# Patient Record
Sex: Female | Born: 1985 | Race: Black or African American | Hispanic: No | Smoking: Current every day smoker
Health system: Southern US, Community
[De-identification: ages and names within clinical notes are randomized; demographics above are authoritative.]

## PROBLEM LIST (undated history)

## (undated) DIAGNOSIS — K589 Irritable bowel syndrome without diarrhea: Secondary | ICD-10-CM

---
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
## 2007-11-27 ENCOUNTER — Emergency Department (HOSPITAL_COMMUNITY): Admission: EM | Admit: 2007-11-27 | Discharge: 2007-11-27 | Payer: Self-pay | Admitting: Emergency Medicine

## 2007-12-04 ENCOUNTER — Emergency Department (HOSPITAL_COMMUNITY): Admission: EM | Admit: 2007-12-04 | Discharge: 2007-12-04 | Payer: Self-pay | Admitting: Emergency Medicine

## 2009-12-31 ENCOUNTER — Encounter: Payer: Self-pay | Admitting: Emergency Medicine

## 2009-12-31 ENCOUNTER — Inpatient Hospital Stay (HOSPITAL_COMMUNITY): Admission: AD | Admit: 2009-12-31 | Discharge: 2009-12-31 | Payer: Self-pay | Admitting: Obstetrics and Gynecology

## 2010-09-27 LAB — DIFFERENTIAL
Basophils Absolute: 0 10*3/uL (ref 0.0–0.1)
Eosinophils Absolute: 0 10*3/uL (ref 0.0–0.7)
Eosinophils Relative: 1 % (ref 0–5)
Lymphocytes Relative: 34 % (ref 12–46)
Lymphs Abs: 1.6 10*3/uL (ref 0.7–4.0)
Monocytes Absolute: 0.4 10*3/uL (ref 0.1–1.0)
Monocytes Relative: 8 % (ref 3–12)
Neutro Abs: 2.7 10*3/uL (ref 1.7–7.7)
Neutrophils Relative %: 57 % (ref 43–77)

## 2010-09-27 LAB — POCT I-STAT, CHEM 8
BUN: 12 mg/dL (ref 6–23)
Glucose, Bld: 81 mg/dL (ref 70–99)
HCT: 40 % (ref 36.0–46.0)
Potassium: 3.7 mEq/L (ref 3.5–5.1)
TCO2: 22 mmol/L (ref 0–100)

## 2010-09-27 LAB — URINALYSIS, ROUTINE W REFLEX MICROSCOPIC
Ketones, ur: NEGATIVE mg/dL
Protein, ur: NEGATIVE mg/dL
Specific Gravity, Urine: 1.026 (ref 1.005–1.030)
Urobilinogen, UA: 0.2 mg/dL (ref 0.0–1.0)
pH: 6 (ref 5.0–8.0)

## 2010-09-27 LAB — CBC
Hemoglobin: 13.4 g/dL (ref 12.0–15.0)
MCHC: 34.6 g/dL (ref 30.0–36.0)
Platelets: 188 10*3/uL (ref 150–400)
RBC: 3.93 MIL/uL (ref 3.87–5.11)
WBC: 4.7 10*3/uL (ref 4.0–10.5)

## 2010-09-27 LAB — POCT PREGNANCY, URINE: Preg Test, Ur: NEGATIVE

## 2010-09-27 LAB — GC/CHLAMYDIA PROBE AMP, GENITAL: GC Probe Amp, Genital: NEGATIVE

## 2010-09-27 LAB — WET PREP, GENITAL

## 2011-11-27 ENCOUNTER — Emergency Department (HOSPITAL_COMMUNITY)
Admission: EM | Admit: 2011-11-27 | Discharge: 2011-11-27 | Disposition: A | Discharge status: Home / Self Care | Payer: Self-pay | Attending: Emergency Medicine | Admitting: Emergency Medicine

## 2011-11-27 ENCOUNTER — Encounter (HOSPITAL_COMMUNITY): Payer: Self-pay | Admitting: Emergency Medicine

## 2011-11-27 DIAGNOSIS — F17200 Nicotine dependence, unspecified, uncomplicated: Secondary | ICD-10-CM | POA: Insufficient documentation

## 2011-11-27 DIAGNOSIS — R109 Unspecified abdominal pain: Secondary | ICD-10-CM | POA: Insufficient documentation

## 2011-11-27 DIAGNOSIS — Z79899 Other long term (current) drug therapy: Secondary | ICD-10-CM | POA: Insufficient documentation

## 2011-11-27 DIAGNOSIS — R112 Nausea with vomiting, unspecified: Secondary | ICD-10-CM | POA: Insufficient documentation

## 2011-11-27 DIAGNOSIS — R197 Diarrhea, unspecified: Secondary | ICD-10-CM | POA: Insufficient documentation

## 2011-11-27 DIAGNOSIS — R10817 Generalized abdominal tenderness: Secondary | ICD-10-CM | POA: Insufficient documentation

## 2011-11-27 LAB — COMPREHENSIVE METABOLIC PANEL
ALT: 51 U/L — ABNORMAL HIGH (ref 0–35)
AST: 28 U/L (ref 0–37)
Calcium: 9.6 mg/dL (ref 8.4–10.5)
GFR calc Af Amer: >90 mL/min (ref >90)
Sodium: 136 mEq/L (ref 135–145)
Total Protein: 7.9 g/dL (ref 6.0–8.3)

## 2011-11-27 LAB — DIFFERENTIAL
Basophils Absolute: 0 10*3/uL (ref 0.0–0.1)
Basophils Relative: 0 % (ref 0–1)
Eosinophils Absolute: 0.1 10*3/uL (ref 0.0–0.7)
Eosinophils Relative: 1 % (ref 0–5)

## 2011-11-27 LAB — CBC
MCH: 33.2 pg (ref 26.0–34.0)
MCV: 96 fL (ref 78.0–100.0)
Platelets: 292 10*3/uL (ref 150–400)
RDW: 12.9 % (ref 11.5–15.5)

## 2011-11-27 LAB — URINALYSIS, ROUTINE W REFLEX MICROSCOPIC
Bilirubin Urine: NEGATIVE
Nitrite: NEGATIVE
Specific Gravity, Urine: 1.021 (ref 1.005–1.030)
pH: 7 (ref 5.0–8.0)

## 2011-11-27 MED ORDER — ONDANSETRON HCL 4 MG/2ML IJ SOLN
4.0000 mg | Freq: Once | INTRAMUSCULAR | Status: AC
  Administered 2011-11-27: 4 mg via INTRAVENOUS
  Filled 2011-11-27: qty 2

## 2011-11-27 MED ORDER — MORPHINE SULFATE 2 MG/ML IJ SOLN
2.0000 mg | Freq: Once | INTRAMUSCULAR | Status: AC
  Administered 2011-11-27: 2 mg via INTRAVENOUS
  Filled 2011-11-27: qty 1

## 2011-11-27 MED ORDER — TRAMADOL HCL 50 MG PO TABS: 50.0000 mg | ORAL_TABLET | Freq: Four times a day (QID) | ORAL | Status: AC | PRN

## 2011-11-27 MED ORDER — DICYCLOMINE HCL 20 MG PO TABS: 20.0000 mg | ORAL_TABLET | Freq: Two times a day (BID) | ORAL | Status: DC

## 2011-11-27 MED ORDER — SODIUM CHLORIDE 0.9 % IV BOLUS (SEPSIS)
1000.0000 mL | Freq: Once | INTRAVENOUS | Status: AC
  Administered 2011-11-27: 1000 mL via INTRAVENOUS

## 2011-11-27 MED ORDER — ONDANSETRON HCL 4 MG PO TABS: 4.0000 mg | ORAL_TABLET | Freq: Four times a day (QID) | ORAL | Status: AC

## 2011-11-27 NOTE — ED Provider Notes (Signed)
History     CSN: [id number]  Arrival date & time 11/27/11  1430   First MD Initiated Contact with Patient 11/27/11 1501      Chief Complaint  Patient presents with  . Nausea  . Emesis  . Diarrhea    (Consider location/radiation/quality/duration/timing/severity/associated sxs/prior treatment) HPI Pt states she think she ate bad hamburger meat 2 days ago and began vomiting and then progressed to diarrhea. She has had multiple episodes today associated with generalized stomach cramps. No fever chills, bloody stools or vomitus. No urinary or vaginal symptoms History reviewed. No pertinent past medical history.  History reviewed. No pertinent past surgical history.  History reviewed. No pertinent family history.  History  Substance Use Topics  . Smoking status: Current Some Day Smoker  . Smokeless tobacco: Not on file  . Alcohol Use: Yes    OB History    Grav Para Term Preterm Abortions TAB SAB Ect Mult Living                  Review of Systems  Constitutional: Negative for fever and chills.  Gastrointestinal: Positive for nausea, vomiting, abdominal pain and diarrhea. Negative for constipation and blood in stool.  Genitourinary: Negative for dysuria, flank pain, vaginal bleeding and vaginal discharge.  Skin: Negative for rash.  Neurological: Negative for weakness and numbness.    Allergies  Review of patient's allergies indicates no known allergies.  Home Medications   Current Outpatient Rx  Name Route Sig Dispense Refill  . ACETAMINOPHEN 500 MG PO TABS Oral Take 1,000 mg by mouth every 6 (six) hours as needed. For pain    . MIDOL COMPLETE PO Oral Take 3 tablets by mouth every 6 (six) hours as needed. For cramping    . DICYCLOMINE HCL 20 MG PO TABS Oral Take 1 tablet (20 mg total) by mouth 2 (two) times daily. 20 tablet 0  . ONDANSETRON HCL 4 MG PO TABS Oral Take 1 tablet (4 mg total) by mouth every 6 (six) hours. 12 tablet 0  . TRAMADOL HCL 50 MG PO TABS Oral  Take 1 tablet (50 mg total) by mouth every 6 (six) hours as needed for pain. 15 tablet 0    BP 103/69  Pulse 63  Temp(Src) 98.1 F (36.7 C) (Oral)  Resp 20  SpO2 100%  LMP 11/19/2011  Physical Exam  Nursing note and vitals reviewed. Constitutional: She is oriented to person, place, and time. She appears well-developed and well-nourished. No distress.  HENT:  Head: Normocephalic and atraumatic.  Mouth/Throat: Oropharynx is clear and moist. No oropharyngeal exudate.  Eyes: EOM are normal. Pupils are equal, round, and reactive to light.  Neck: Normal range of motion. Neck supple.  Cardiovascular: Normal rate and regular rhythm.   Pulmonary/Chest: Effort normal and breath sounds normal. No respiratory distress. She has no wheezes. She has no rales.  Abdominal: Soft. Bowel sounds are normal. There is tenderness (very mild generalized TTP without focality, rebound or guarding). There is no rebound and no guarding.  Musculoskeletal: Normal range of motion. She exhibits no edema and no tenderness.  Neurological: She is alert and oriented to person, place, and time.  Skin: Skin is warm and dry. No rash noted. No erythema.  Psychiatric: She has a normal mood and affect. Her behavior is normal.    ED Course  Procedures (including critical care time)  Labs Reviewed  COMPREHENSIVE METABOLIC PANEL - Abnormal; Notable for the following:    ALT 51 (*)  All other components within normal limits  CBC  DIFFERENTIAL  URINALYSIS, ROUTINE W REFLEX MICROSCOPIC  PREGNANCY, URINE   No results found.   1. Vomiting and diarrhea       MDM  Likely food poisoning vs viral gastroenteritis        Loren Racer, MD 11/27/11 [phone]

## 2011-11-27 NOTE — ED Notes (Signed)
Pt c/o N/V/D onset Thursday. Pt reports nausea and vomiting resolved but still had diarrhea.

## 2011-11-27 NOTE — Discharge Instructions (Signed)
Diet for Diarrhea, Adult Having frequent, runny stools (diarrhea) has many causes. Diarrhea may be caused or worsened by food or drink. Diarrhea may be relieved by changing your diet. IF YOU ARE NOT TOLERATING SOLID FOODS:  Drink enough water and fluids to keep your urine clear or pale yellow.   Avoid sugary drinks and sodas as well as milk-based beverages.   Avoid beverages containing caffeine and alcohol.   You may try rehydrating beverages. You can make your own by following this recipe:    tsp table salt.    tsp baking soda.   ? tsp salt substitute (potassium chloride).   1 tbs + 1 tsp sugar.   1 qt water.  As your stools become more solid, you can start eating solid foods. Add foods one at a time. If a certain food causes your diarrhea to get worse, avoid that food and try other foods. A low fiber, low-fat, and lactose-free diet is recommended. Small, frequent meals may be better tolerated.  Starches  Allowed:  White, French, and pita breads, plain rolls, buns, bagels. Plain muffins, matzo. Soda, saltine, or graham crackers. Pretzels, melba toast, zwieback. Cooked cereals made with water: cornmeal, farina, cream cereals. Dry cereals: refined corn, wheat, rice. Potatoes prepared any way without skins, refined macaroni, spaghetti, noodles, refined rice.   Avoid:  Bread, rolls, or crackers made with whole wheat, multi-grains, rye, bran seeds, nuts, or coconut. Corn tortillas or taco shells. Cereals containing whole grains, multi-grains, bran, coconut, nuts, or raisins. Cooked or dry oatmeal. Coarse wheat cereals, granola. Cereals advertised as "high-fiber." Potato skins. Whole grain pasta, wild or brown rice. Popcorn. Sweet potatoes/yams. Sweet rolls, doughnuts, waffles, pancakes, sweet breads.  Vegetables  Allowed: Strained tomato and vegetable juices. Most well-cooked and canned vegetables without seeds. Fresh: Tender lettuce, cucumber without the skin, cabbage, spinach, bean  sprouts.   Avoid: Fresh, cooked, or canned: Artichokes, baked beans, beet greens, broccoli, Brussels sprouts, corn, kale, legumes, peas, sweet potatoes. Cooked: Green or red cabbage, spinach. Avoid large servings of any vegetables, because vegetables shrink when cooked, and they contain more fiber per serving than fresh vegetables.  Fruit  Allowed: All fruit juices except prune juice. Cooked or canned: Apricots, applesauce, cantaloupe, cherries, fruit cocktail, grapefruit, grapes, kiwi, mandarin oranges, peaches, pears, plums, watermelon. Fresh: Apples without skin, ripe banana, grapes, cantaloupe, cherries, grapefruit, peaches, oranges, plums. Keep servings limited to  cup or 1 piece.   Avoid: Fresh: Apple with skin, apricots, mango, pears, raspberries, strawberries. Prune juice, stewed or dried prunes. Dried fruits, raisins, dates. Large servings of all fresh fruits.  Meat and Meat Substitutes  Allowed: Ground or well-cooked tender beef, ham, veal, lamb, pork, or poultry. Eggs, plain cheese. Fish, oysters, shrimp, lobster, other seafoods. Liver, organ meats.   Avoid: Tough, fibrous meats with gristle. Peanut butter, smooth or chunky. Cheese, nuts, seeds, legumes, dried peas, beans, lentils.  Milk  Allowed: Yogurt, lactose-free milk, kefir, drinkable yogurt, buttermilk, soy milk.   Avoid: Milk, chocolate milk, beverages made with milk, such as milk shakes.  Soups  Allowed: Bouillon, broth, or soups made from allowed foods. Any strained soup.   Avoid: Soups made from vegetables that are not allowed, cream or milk-based soups.  Desserts and Sweets  Allowed: Sugar-free gelatin, sugar-free frozen ice pops made without sugar alcohol.   Avoid: Plain cakes and cookies, pie made with allowed fruit, pudding, custard, cream pie. Gelatin, fruit, ice, sherbet, frozen ice pops. Ice cream, ice milk without nuts. Plain hard candy,   honey, jelly, molasses, syrup, sugar, chocolate syrup, gumdrops,  marshmallows.  Fats and Oils  Allowed: Avoid any fats and oils.   Avoid: Seeds, nuts, olives, avocados. Margarine, butter, cream, mayonnaise, salad oils, plain salad dressings made from allowed foods. Plain gravy, crisp bacon without rind.  Beverages  Allowed: Water, decaffeinated teas, oral rehydration solutions, sugar-free beverages.   Avoid: Fruit juices, caffeinated beverages (coffee, tea, soda or pop), alcohol, sports drinks, or lemon-lime soda or pop.  Condiments  Allowed: Ketchup, mustard, horseradish, vinegar, cream sauce, cheese sauce, cocoa powder. Spices in moderation: allspice, basil, bay leaves, celery powder or leaves, cinnamon, cumin powder, curry powder, ginger, mace, marjoram, onion or garlic powder, oregano, paprika, parsley flakes, ground pepper, rosemary, sage, savory, tarragon, thyme, turmeric.   Avoid: Coconut, honey.  Weight Monitoring: Weigh yourself every day. You should weigh yourself in the morning after you urinate and before you eat breakfast. Wear the same amount of clothing when you weigh yourself. Record your weight daily. Bring your recorded weights to your clinic visits. Tell your caregiver right away if you have gained 3 lb/1.4 kg or more in 1 day, 5 lb/2.3 kg in a week, or whatever amount you were told to report. SEEK IMMEDIATE MEDICAL CARE IF:   You are unable to keep fluids down.   You start to throw up (vomit) or diarrhea keeps coming back (persistent).   Abdominal pain develops, increases, or can be felt in one place (localizes).   You have an oral temperature above 102 F (38.9 C), not controlled by medicine.   Diarrhea contains blood or mucus.   You develop excessive weakness, dizziness, fainting, or extreme thirst.  MAKE SURE YOU:   Understand these instructions.   Will watch your condition.   Will get help right away if you are not doing well or get worse.  Document Released: 09/18/2003 Document Revised: 06/17/2011 Document Reviewed:  01/09/2009 [employer] Patient Information 2012 [employer]. 

## 2012-09-17 ENCOUNTER — Emergency Department (HOSPITAL_COMMUNITY)
Admission: EM | Admit: 2012-09-17 | Discharge: 2012-09-17 | Disposition: A | Discharge status: Home / Self Care | Payer: BC Managed Care – PPO | Attending: Emergency Medicine | Admitting: Emergency Medicine

## 2012-09-17 ENCOUNTER — Emergency Department (HOSPITAL_COMMUNITY): Payer: BC Managed Care – PPO

## 2012-09-17 ENCOUNTER — Encounter (HOSPITAL_COMMUNITY): Payer: Self-pay | Admitting: Emergency Medicine

## 2012-09-17 ENCOUNTER — Encounter (HOSPITAL_COMMUNITY): Payer: Self-pay | Admitting: *Deleted

## 2012-09-17 ENCOUNTER — Emergency Department (HOSPITAL_COMMUNITY)
Admission: EM | Admit: 2012-09-17 | Discharge: 2012-09-17 | Disposition: A | Discharge status: Home / Self Care | Payer: BC Managed Care – PPO | Source: Home / Self Care

## 2012-09-17 DIAGNOSIS — S8990XA Unspecified injury of unspecified lower leg, initial encounter: Secondary | ICD-10-CM | POA: Insufficient documentation

## 2012-09-17 DIAGNOSIS — M25562 Pain in left knee: Secondary | ICD-10-CM

## 2012-09-17 DIAGNOSIS — Y92410 Unspecified street and highway as the place of occurrence of the external cause: Secondary | ICD-10-CM | POA: Insufficient documentation

## 2012-09-17 DIAGNOSIS — Y9389 Activity, other specified: Secondary | ICD-10-CM | POA: Insufficient documentation

## 2012-09-17 DIAGNOSIS — M79642 Pain in left hand: Secondary | ICD-10-CM

## 2012-09-17 DIAGNOSIS — S0990XA Unspecified injury of head, initial encounter: Secondary | ICD-10-CM | POA: Insufficient documentation

## 2012-09-17 DIAGNOSIS — R51 Headache: Secondary | ICD-10-CM

## 2012-09-17 DIAGNOSIS — M25549 Pain in joints of unspecified hand: Secondary | ICD-10-CM

## 2012-09-17 MED ORDER — ONDANSETRON 4 MG PO TBDP
4.0000 mg | ORAL_TABLET | Freq: Once | ORAL | Status: AC
  Administered 2012-09-17: 4 mg via ORAL
  Filled 2012-09-17: qty 1

## 2012-09-17 MED ORDER — ACETAMINOPHEN 500 MG PO TABS
1000.0000 mg | ORAL_TABLET | Freq: Once | ORAL | Status: AC
  Administered 2012-09-17: 1000 mg via ORAL
  Filled 2012-09-17: qty 2

## 2012-09-17 MED ORDER — IBUPROFEN 800 MG PO TABS: 800.0000 mg | ORAL_TABLET | Freq: Three times a day (TID) | ORAL | Status: DC

## 2012-09-17 MED ORDER — HYDROCODONE-ACETAMINOPHEN 5-325 MG PO TABS: 2.0000 | ORAL_TABLET | ORAL | Status: DC | PRN
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
## 2012-09-17 NOTE — ED Notes (Signed)
D/c instructions reviewed w/ pt and family - pt and family deny any further questions or concerns at present. Pt ambulating w/ assistance of crutches on d/c, in no acute distress - A&Ox4 - in no acute distress.

## 2012-09-17 NOTE — ED Notes (Signed)
Reports tenderness and pain for 3-4 days, today is swollen.  Reports left index finger is not hurting , but joint at hand is very painful
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
## 2012-09-17 NOTE — ED Notes (Signed)
tient requested work note, verified with physician

## 2012-09-17 NOTE — ED Provider Notes (Signed)
History     CSN: [id number]  Arrival date & time 09/17/12  1151   None     Chief Complaint  Patient presents with  . Hand Pain    (Consider location/radiation/quality/duration/timing/severity/associated sxs/prior treatment) Patient is a 27 y.o. female presenting with hand pain. The history is provided by the patient. No language interpreter was used.  Hand Pain This is a new problem. Episode onset: 4 days. The problem occurs constantly. The problem has been gradually worsening. Nothing aggravates the symptoms. Nothing relieves the symptoms. She has tried nothing for the symptoms.  Pt complains of pain in her left hand,  Pain with bending left 2nd finger  History reviewed. No pertinent past medical history.  History reviewed. No pertinent past surgical history.  No family history on file.  History  Substance Use Topics  . Smoking status: Never Smoker   . Smokeless tobacco: Not on file  . Alcohol Use: Yes    OB History   Grav Para Term Preterm Abortions TAB SAB Ect Mult Living                  Review of Systems  Musculoskeletal: Positive for joint swelling.  All other systems reviewed and are negative.    Allergies  Review of patient's allergies indicates no known allergies.  Home Medications   Current Outpatient Rx  Name  Route  Sig  Dispense  Refill  . acetaminophen (TYLENOL) 500 MG tablet   Oral   Take 1,000 mg by mouth every 6 (six) hours as needed. For pain         . Acetaminophen-Caff-Pyrilamine (MIDOL COMPLETE PO)   Oral   Take 3 tablets by mouth every 6 (six) hours as needed. For cramping         . dicyclomine (BENTYL) 20 MG tablet   Oral   Take 1 tablet (20 mg total) by mouth 2 (two) times daily.   20 tablet   0     BP 124/76  Pulse 75  Temp(Src) 98.3 F (36.8 C) (Oral)  Resp 17  SpO2 100%  LMP 08/20/2012  Physical Exam  Nursing note and vitals reviewed. Constitutional: She appears well-developed and well-nourished.  HENT:   Head: Normocephalic.  Cardiovascular: Normal rate.   Pulmonary/Chest: Effort normal.  Musculoskeletal: She exhibits tenderness.  Swollen 2nd finger, decreased range of motion,  nv and ns intact  Neurological: She is alert.  Skin: Skin is warm.    ED Course  Procedures (including critical care time)  Labs Reviewed - No data to display No results found.   1. Hand joint pain, left       MDM  Finger splint,   Follow up with hand for evaluation this week.   Ibuprofen 800mg  one po tid  Hydrocodone        Lonia Skinner [place], PA-C 09/17/12 1311  Lonia Skinner [place], [place] 09/17/12 1546  Lonia Skinner [place], PA-C 09/17/12 1547

## 2012-09-17 NOTE — ED Notes (Signed)
Patient transported to X-ray 
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
## 2012-09-18 NOTE — ED Provider Notes (Signed)
Medical screening examination/treatment/procedure(s) were performed by non-physician practitioner and as supervising physician I was immediately available for consultation/collaboration.   David Glick, MD 09/18/12 1456 

## 2012-09-19 ENCOUNTER — Encounter (HOSPITAL_COMMUNITY): Payer: Self-pay | Admitting: Emergency Medicine

## 2012-09-19 ENCOUNTER — Emergency Department (HOSPITAL_COMMUNITY)
Admission: EM | Admit: 2012-09-19 | Discharge: 2012-09-19 | Disposition: A | Discharge status: Home / Self Care | Payer: BC Managed Care – PPO | Attending: Emergency Medicine | Admitting: Emergency Medicine

## 2012-09-19 DIAGNOSIS — S8392XA Sprain of unspecified site of left knee, initial encounter: Secondary | ICD-10-CM

## 2012-09-19 DIAGNOSIS — IMO0002 Reserved for concepts with insufficient information to code with codable children: Secondary | ICD-10-CM | POA: Insufficient documentation

## 2012-09-19 DIAGNOSIS — Y92410 Unspecified street and highway as the place of occurrence of the external cause: Secondary | ICD-10-CM | POA: Insufficient documentation

## 2012-09-19 DIAGNOSIS — Y9389 Activity, other specified: Secondary | ICD-10-CM | POA: Insufficient documentation
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
## 2012-09-19 NOTE — Progress Notes (Signed)
Orthopedic Tech Progress Note Patient Details:  Jean Reynolds [birth year] [id number] Finger splint and knee immobilizer applied. Tolerated well. Care instructions provided.   Ortho Devices Type of Ortho Device: Finger splint;Knee Immobilizer Ortho Device/Splint Interventions: Application   Asia R Thompson 09/19/2012, 11:29 AM

## 2012-09-19 NOTE — ED Provider Notes (Signed)
Medical screening examination/treatment/procedure(s) were performed by non-physician practitioner and as supervising physician I was immediately available for consultation/collaboration.    Vida Roller, MD 09/19/12 2139

## 2012-09-23 ENCOUNTER — Emergency Department (HOSPITAL_COMMUNITY)
Admission: EM | Admit: 2012-09-23 | Discharge: 2012-09-23 | Disposition: A | Discharge status: Home / Self Care | Payer: BC Managed Care – PPO | Attending: Emergency Medicine | Admitting: Emergency Medicine

## 2012-09-23 ENCOUNTER — Encounter (HOSPITAL_COMMUNITY): Payer: Self-pay | Admitting: Nurse Practitioner

## 2012-09-23 DIAGNOSIS — M79645 Pain in left finger(s): Secondary | ICD-10-CM

## 2012-09-23 DIAGNOSIS — M25549 Pain in joints of unspecified hand: Secondary | ICD-10-CM | POA: Insufficient documentation

## 2012-09-23 NOTE — ED Provider Notes (Signed)
Medical screening examination/treatment/procedure(s) were performed by non-physician practitioner and as supervising physician I was immediately available for consultation/collaboration.    Oyindamola Key R Lisett Dirusso, MD 09/23/12 1612 
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
## 2012-09-23 NOTE — ED Provider Notes (Signed)
History     CSN: [id number]  Arrival date & time 09/23/12  1153   First MD Initiated Contact with Patient 09/23/12 1351      Chief Complaint  Patient presents with  . Finger Injury    (Consider location/radiation/quality/duration/timing/severity/associated sxs/prior treatment) HPI Comments: Patient was seen 3/9 for swollen, painful 2nd MCP joint.  Patient reports she initially had pain and swelling of the joint that has now greatly improved.  She is using a finger splint and motrin for pain, which she states helps significantly.  Pain continues with any lifting or palpation or movement of the joint.  Was sent home from work today because she was not able to lift heavy objects required for her job.  Denies injury.  Denies weakness or numbness of the finger.    The history is provided by the patient.    History reviewed. No pertinent past medical history.  History reviewed. No pertinent past surgical history.  History reviewed. No pertinent family history.  History  Substance Use Topics  . Smoking status: Never Smoker   . Smokeless tobacco: Not on file  . Alcohol Use: Yes    OB History   Grav Para Term Preterm Abortions TAB SAB Ect Mult Living                  Review of Systems  Skin: Negative for color change, pallor and wound.  Neurological: Negative for weakness and numbness.    Allergies  Review of patient's allergies indicates no known allergies.  Home Medications   Current Outpatient Rx  Name  Route  Sig  Dispense  Refill  . HYDROcodone-acetaminophen (NORCO/VICODIN) 5-325 MG per tablet   Oral   Take 2 tablets by mouth every 4 (four) hours as needed for pain.   10 tablet   0   . ibuprofen (ADVIL,MOTRIN) 800 MG tablet   Oral   Take 1 tablet (800 mg total) by mouth 3 (three) times daily.   21 tablet   0     BP 120/65  Pulse 91  Temp(Src) 97.9 F (36.6 C) (Oral)  Resp 16  SpO2 99%  LMP 08/20/2012  Physical Exam  Nursing note and vitals  reviewed. Constitutional: She appears well-developed and well-nourished. No distress.  HENT:  Head: Normocephalic and atraumatic.  Neck: Neck supple.  Pulmonary/Chest: Effort normal.  Musculoskeletal:       Hands: 2nd MCP tender to palpation.  Pt is able to completely extend finger.  Unable to completely flex secondary to pain.  No edema, warmth, or erythema.  PIP and DIP ROM intact, capillary refill < 2 seconds, sensation intact.    Neurological: She is alert.  Skin: She is not diaphoretic.    ED Course  Procedures (including critical care time)  Labs Reviewed - No data to display No results found.   1. Finger pain, left    MDM  Pt with continued pain in left 2nd MCP joint but with decreased swelling.  Pt reports great improvement in symptoms.  Given history and exam, doubt septic joint or any infection.  Pt does continue to have pain in joint and slight decrease ROM - I have strongly encouraged her to follow up with hand specialist to whom she was previously referred. Pt given return precautions.  Pt verbalizes understanding and agrees with plan.          Trixie Dredge, PA-C 09/23/12 1502

## 2013-05-11 ENCOUNTER — Emergency Department (HOSPITAL_COMMUNITY)
Admission: EM | Admit: 2013-05-11 | Discharge: 2013-05-11 | Disposition: A | Discharge status: Home / Self Care | Payer: BC Managed Care – PPO | Attending: Emergency Medicine | Admitting: Emergency Medicine

## 2013-05-11 ENCOUNTER — Encounter (HOSPITAL_COMMUNITY): Payer: Self-pay | Admitting: Emergency Medicine

## 2013-05-11 DIAGNOSIS — R51 Headache: Secondary | ICD-10-CM | POA: Insufficient documentation

## 2013-05-11 DIAGNOSIS — K0889 Other specified disorders of teeth and supporting structures: Secondary | ICD-10-CM

## 2013-05-11 DIAGNOSIS — K089 Disorder of teeth and supporting structures, unspecified: Secondary | ICD-10-CM | POA: Insufficient documentation

## 2013-05-11 DIAGNOSIS — R630 Anorexia: Secondary | ICD-10-CM | POA: Insufficient documentation

## 2013-05-11 MED ORDER — OXYCODONE-ACETAMINOPHEN 5-325 MG PO TABS: 1.0000 | ORAL_TABLET | ORAL | Status: DC | PRN

## 2013-05-11 MED ORDER — OXYCODONE-ACETAMINOPHEN 5-325 MG PO TABS
1.0000 | ORAL_TABLET | Freq: Once | ORAL | Status: AC
  Administered 2013-05-11: 1 via ORAL
  Filled 2013-05-11: qty 1

## 2013-05-11 MED ORDER — KETOROLAC TROMETHAMINE 60 MG/2ML IM SOLN
60.0000 mg | Freq: Once | INTRAMUSCULAR | Status: AC
  Administered 2013-05-11: 60 mg via INTRAMUSCULAR
  Filled 2013-05-11: qty 2

## 2013-05-11 MED ORDER — IBUPROFEN 800 MG PO TABS: 800.0000 mg | ORAL_TABLET | Freq: Three times a day (TID) | ORAL | Status: DC

## 2013-05-11 MED ORDER — PENICILLIN V POTASSIUM 500 MG PO TABS: 500.0000 mg | ORAL_TABLET | Freq: Three times a day (TID) | ORAL | Status: DC

## 2013-05-11 NOTE — ED Notes (Signed)
Pt given applesauce to take ahead of percocet.

## 2013-05-11 NOTE — ED Provider Notes (Signed)
CSN: [id number]     Arrival date & time 05/11/13  1726 History  This chart was scribed for non-physician practitioner Jaynie Crumble working with Shelda Jakes, MD by Carl Best, ED Scribe. This patient was seen in room TR05C/TR05C and the patient's care was started at 5:46 PM.      Chief Complaint  Patient presents with  . Dental Pain    Patient is a 27 y.o. female presenting with tooth pain. The history is provided by the patient. No language interpreter was used.  Dental Pain Associated symptoms: no fever    HPI Comments: Jean Reynolds is a 27 y.o. female who presents to the Emergency Department complaining of constant pain located on the upper left side of her mouth that radiates to the rest of her face that started three days ago.  She states that she has not been able to eat anything because of her pain.  She states that she has taken oragel, ibuprofen, excedrin, and tylenol for her pain with no relief.  She states that she has tried rinsing her mouth out with salt water and listerine with no relief to her symptoms.  She denies fever, sore throat, and ear pain as associated symptoms.  She denies any injury to her teeth.  She states that she cannot afford a dentist.    History reviewed. No pertinent past medical history. History reviewed. No pertinent past surgical history. No family history on file. History  Substance Use Topics  . Smoking status: Never Smoker   . Smokeless tobacco: Not on file  . Alcohol Use: Yes   OB History   Grav Para Term Preterm Abortions TAB SAB Ect Mult Living                 Review of Systems  Constitutional: Positive for appetite change. Negative for fever.  HENT: Positive for dental problem. Negative for ear pain and sore throat.   All other systems reviewed and are negative.    Allergies  Review of patient's allergies indicates no known allergies.  Home Medications   No current outpatient prescriptions on file.  Triage  Vitals: BP 114/64  Pulse 82  Temp(Src) 99.4 F (37.4 C) (Oral)  Resp 16  Ht 5\' 2"  (1.575 m)  Wt 145 lb (65.772 kg)  BMI 26.51 kg/m2  SpO2 98%  LMP 04/10/2013  Physical Exam  Nursing note and vitals reviewed. Constitutional: She is oriented to person, place, and time. She appears well-developed and well-nourished. No distress.  HENT:  Head: Normocephalic and atraumatic.  Mouth/Throat: Oropharynx is clear and moist. No oropharyngeal exudate.  Normal dentition, normal gums, no swelling, tender over left upper second and first molars, no facial swelling, no swelling under the tongue.   Eyes: EOM are normal. Pupils are equal, round, and reactive to light.  Neck: Normal range of motion. Neck supple. No tracheal deviation present.  Cardiovascular: Normal rate, regular rhythm and normal heart sounds.   Pulmonary/Chest: Effort normal and breath sounds normal. No respiratory distress.  Abdominal: Soft. Bowel sounds are normal. There is no tenderness.  Musculoskeletal: Normal range of motion.  Neurological: She is alert and oriented to person, place, and time.  Skin: Skin is warm and dry.  Psychiatric: She has a normal mood and affect. Her behavior is normal.    ED Course  Procedures (including critical care time)  DIAGNOSTIC STUDIES: Oxygen Saturation is 98% on room air, normal by my interpretation.    COORDINATION OF CARE: 5:48 PM-  Discussed a clinical suspicion of an oral abscess.  Discussed discharging the patient with antibiotics and pain medication and the patient agreed to the treatment plan.    Labs Review Labs Reviewed - No data to display Imaging Review No results found.  EKG Interpretation   None       MDM   1. Pain, dental     Patient is an emergency department complaining of left upper tooth pain. States unable to eat or drink anything without pain also air bothers the tooth. Patient is hysterical and emergency department, pain is out of proportion to the  findings on exam. Suspect early abscess however I do not see anything on exam. I will start her on a biotics, pain medications given in emergency department. Percocet and Toradol shot given. Home with Percocet, ibuprofen, penicillin, followup with a dentist  Filed Vitals:   05/11/13 1732 05/11/13 1734  BP: 114/64   Pulse: 82   Temp: 99.4 F (37.4 C)   TempSrc: Oral   Resp: 16   Height:  5\' 2"  (1.575 m)  Weight:  145 lb (65.772 kg)  SpO2: 98%    I personally performed the services described in this documentation, which was scribed in my presence. The recorded information has been reviewed and is accurate.    Lottie Mussel, PA-C 05/11/13 2039

## 2013-05-11 NOTE — ED Notes (Signed)
Pt c/o left lower mouth and facial pain x 3 days. Pt very anxious, crying.

## 2013-05-13 NOTE — ED Provider Notes (Signed)
Medical screening examination/treatment/procedure(s) were conducted as a shared visit with non-physician practitioner(s) and myself.  I personally evaluated the patient during the encounter.  EKG Interpretation   None         Shelda Jakes, MD 05/13/13 [phone]

## 2013-10-28 DIAGNOSIS — K089 Disorder of teeth and supporting structures, unspecified: Secondary | ICD-10-CM | POA: Insufficient documentation

## 2013-10-28 DIAGNOSIS — Z792 Long term (current) use of antibiotics: Secondary | ICD-10-CM | POA: Insufficient documentation

## 2013-10-29 ENCOUNTER — Encounter (HOSPITAL_COMMUNITY): Payer: Self-pay | Admitting: Emergency Medicine

## 2013-10-29 ENCOUNTER — Emergency Department (HOSPITAL_COMMUNITY)
Admission: EM | Admit: 2013-10-29 | Discharge: 2013-10-29 | Disposition: A | Discharge status: Home / Self Care | Payer: BC Managed Care – PPO | Attending: Emergency Medicine | Admitting: Emergency Medicine

## 2013-10-29 DIAGNOSIS — K0889 Other specified disorders of teeth and supporting structures: Secondary | ICD-10-CM

## 2013-10-29 MED ORDER — IBUPROFEN 800 MG PO TABS: 800.0000 mg | ORAL_TABLET | Freq: Three times a day (TID) | ORAL | Status: DC

## 2013-10-29 NOTE — ED Provider Notes (Signed)
CSN: [id number]     Arrival date & time 10/28/13  2350 History   First MD Initiated Contact with Patient 10/29/13 0102     Chief Complaint  Patient presents with  . Dental Pain     (Consider location/radiation/quality/duration/timing/severity/associated sxs/prior Treatment) HPI Comments: Patient is otherwise heatlhy 28 year old female who presents to the ED with right upper dental pain - she states this has been going on for 1 week and she has tried multiple medications without relief - she states she cannot afford to see a dentist at this time.  She deneis fever, chills, inability to open her mouth.  Patient is a 28 y.o. female presenting with tooth pain. The history is provided by the patient. No language interpreter was used.  Dental Pain Location:  Upper Upper teeth location:  3/RU 1st molar Quality:  Aching and constant Severity:  Severe Onset quality:  Gradual Duration:  1 week Timing:  Constant Progression:  Worsening Chronicity:  New Context: not abscess, not crown fracture, not dental caries, not dental fracture, not enamel fracture, not intrusion, not malocclusion and not recent dental surgery   Relieved by:  Nothing Worsened by:  Nothing tried Ineffective treatments:  Acetaminophen and NSAIDs Associated symptoms: no congestion, no drooling, no facial pain, no facial swelling, no fever, no headaches, no neck pain, no neck swelling, no oral bleeding, no oral lesions and no trismus     History reviewed. No pertinent past medical history. History reviewed. No pertinent past surgical history. History reviewed. No pertinent family history. History  Substance Use Topics  . Smoking status: Never Smoker   . Smokeless tobacco: Not on file  . Alcohol Use: Yes   OB History   Grav Para Term Preterm Abortions TAB SAB Ect Mult Living                 Review of Systems  Constitutional: Negative for fever.  HENT: Negative for congestion, drooling, facial swelling and mouth  sores.   Musculoskeletal: Negative for neck pain.  Neurological: Negative for headaches.  All other systems reviewed and are negative.     Allergies  Review of patient's allergies indicates no known allergies.  Home Medications   Prior to Admission medications   Medication Sig Start Date End Date Taking? Authorizing Provider  ibuprofen (ADVIL,MOTRIN) 800 MG tablet Take 1 tablet (800 mg total) by mouth 3 (three) times daily. 05/11/13   Tatyana A Kirichenko, PA-C  oxyCODONE-acetaminophen (PERCOCET) 5-325 MG per tablet Take 1 tablet by mouth every 4 (four) hours as needed for pain. 05/11/13   Tatyana A Kirichenko, PA-C  penicillin v potassium (VEETID) 500 MG tablet Take 1 tablet (500 mg total) by mouth 3 (three) times daily. 05/11/13   Tatyana A Kirichenko, PA-C   BP 132/76  Pulse 73  Temp(Src) 98.9 F (37.2 C) (Oral)  Resp 18  Ht 5\' 2"  (1.575 m)  Wt 161 lb 3 oz (73.114 kg)  BMI 29.47 kg/m2  SpO2 100%  LMP 09/28/2013 Physical Exam  Nursing note and vitals reviewed. Constitutional: She is oriented to person, place, and time. She appears well-developed and well-nourished. No distress.  HENT:  Head: Normocephalic and atraumatic.  Right Ear: External ear normal.  Left Ear: External ear normal.  Nose: Nose normal.  Mouth/Throat: Oropharynx is clear and moist. No oropharyngeal exudate.  No dental abnormalities noted.  Eyes: Conjunctivae are normal. No scleral icterus.  Neck: Normal range of motion.  Pulmonary/Chest: Effort normal.  Musculoskeletal: Normal range of  motion. She exhibits no edema and no tenderness.  Neurological: She is alert and oriented to person, place, and time. She exhibits normal muscle tone. Coordination normal.  Skin: Skin is warm and dry. No rash noted. No erythema. No pallor.  Psychiatric: She has a normal mood and affect. Her behavior is normal. Judgment and thought content normal.    ED Course  Procedures (including critical care time) Labs  Review Labs Reviewed - No data to display  Imaging Review No results found.   EKG Interpretation None      MDM   Dental pain  Patient here with dental pain though I do not see any acute abnormalities in her mouth - I have informed her that she will not be getting narcotic pain medication for this at which time she stops rocking back and forth, crying and begins to argue with me which makes me suspicious for narcotic seeking behavior.    Idalia Needle Joelyn Oms, PA-C 10/29/13 0115

## 2013-10-29 NOTE — ED Notes (Signed)
RU dental pain X7 days.

## 2013-10-29 NOTE — ED Notes (Signed)
Patient presents stating that her mouth has been hurting for the past 7 days.  Has been using OTC meds without relief

## 2013-10-29 NOTE — ED Provider Notes (Signed)
Medical screening examination/treatment/procedure(s) were performed by non-physician practitioner and as supervising physician I was immediately available for consultation/collaboration.   EKG Interpretation None        Julianne Rice, MD 10/29/13 [phone]

## 2013-10-29 NOTE — Discharge Instructions (Signed)
Dental Pain A tooth ache may be caused by cavities (tooth decay). Cavities expose the nerve of the tooth to air and hot or cold temperatures. It may come from an infection or abscess (also called a boil or furuncle) around your tooth. It is also often caused by dental caries (tooth decay). This causes the pain you are having. DIAGNOSIS  Your caregiver can diagnose this problem by exam. TREATMENT   If caused by an infection, it may be treated with medications which kill germs (antibiotics) and pain medications as prescribed by your caregiver. Take medications as directed.  Only take over-the-counter or prescription medicines for pain, discomfort, or fever as directed by your caregiver.  Whether the tooth ache today is caused by infection or dental disease, you should see your dentist as soon as possible for further care. SEEK MEDICAL CARE IF: The exam and treatment you received today has been provided on an emergency basis only. This is not a substitute for complete medical or dental care. If your problem worsens or new problems (symptoms) appear, and you are unable to meet with your dentist, call or return to this location. SEEK IMMEDIATE MEDICAL CARE IF:   You have a fever.  You develop redness and swelling of your face, jaw, or neck.  You are unable to open your mouth.  You have severe pain uncontrolled by pain medicine. MAKE SURE YOU:   Understand these instructions.  Will watch your condition.  Will get help right away if you are not doing well or get worse. Document Released: 06/28/2005 Document Revised: 09/20/2011 Document Reviewed: 02/14/2008 [facility] Patient Information 2014 [place].  Dental Care and Dentist Visits Dental care supports good overall health. Regular dental visits can also help you avoid dental pain, bleeding, infection, and other more serious health problems in the future. It is important to keep the mouth healthy because diseases in the teeth, gums,  and other oral tissues can spread to other areas of the body. Some problems, such as diabetes, heart disease, and pre-term labor have been associated with poor oral health.  See your dentist every 6 months. If you experience emergency problems such as a toothache or broken tooth, go to the dentist right away. If you see your dentist regularly, you may catch problems early. It is easier to be treated for problems in the early stages.  WHAT TO EXPECT AT A DENTIST VISIT  Your dentist will look for many common oral health problems and recommend proper treatment. At your regular dental visit, you can expect:  Gentle cleaning of the teeth and gums. This includes scraping and polishing. This helps to remove the sticky substance around the teeth and gums (plaque). Plaque forms in the mouth shortly after eating. Over time, plaque hardens on the teeth as tartar. If tartar is not removed regularly, it can cause problems. Cleaning also helps remove stains.  Periodic X-rays. These pictures of the teeth and supporting bone will help your dentist assess the health of your teeth.  Periodic fluoride treatments. Fluoride is a natural mineral shown to help strengthen teeth. Fluoride treatmentinvolves applying a fluoride gel or varnish to the teeth. It is most commonly done in children.  Examination of the mouth, tongue, jaws, teeth, and gums to look for any oral health problems, such as:  Cavities (dental caries). This is decay on the tooth caused by plaque, sugar, and acid in the mouth. It is best to catch a cavity when it is small.  Inflammation of the gums  caused by plaque buildup (gingivitis).  Problems with the mouth or malformed or misaligned teeth.  Oral cancer or other diseases of the soft tissues or jaws. KEEP YOUR TEETH AND GUMS HEALTHY For healthy teeth and gums, follow these general guidelines as well as your dentist's specific advice:  Have your teeth professionally cleaned at the dentist every 6  months.  Brush twice daily with a fluoride toothpaste.  Floss your teeth daily.  Ask your dentist if you need fluoride supplements, treatments, or fluoride toothpaste.  Eat a healthy diet. Reduce foods and drinks with added sugar.  Avoid smoking. TREATMENT FOR ORAL HEALTH PROBLEMS If you have oral health problems, treatment varies depending on the conditions present in your teeth and gums.  Your caregiver will most likely recommend good oral hygiene at each visit.  For cavities, gingivitis, or other oral health disease, your caregiver will perform a procedure to treat the problem. This is typically done at a separate appointment. Sometimes your caregiver will refer you to another dental specialist for specific tooth problems or for surgery. SEEK IMMEDIATE DENTAL CARE IF:  You have pain, bleeding, or soreness in the gum, tooth, jaw, or mouth area.  A permanent tooth becomes loose or separated from the gum socket.  You experience a blow or injury to the mouth or jaw area. Document Released: 03/10/2011 Document Revised: 09/20/2011 Document Reviewed: 03/10/2011 [facility] Patient Information 2014 [place], [place].

## 2013-12-11 ENCOUNTER — Encounter (HOSPITAL_COMMUNITY): Payer: Self-pay | Admitting: Emergency Medicine

## 2013-12-11 ENCOUNTER — Emergency Department (HOSPITAL_COMMUNITY)
Admission: EM | Admit: 2013-12-11 | Discharge: 2013-12-11 | Disposition: A | Discharge status: Home / Self Care | Payer: BC Managed Care – PPO | Attending: Emergency Medicine | Admitting: Emergency Medicine

## 2013-12-11 DIAGNOSIS — K002 Abnormalities of size and form of teeth: Secondary | ICD-10-CM | POA: Insufficient documentation

## 2013-12-11 DIAGNOSIS — R221 Localized swelling, mass and lump, neck: Principal | ICD-10-CM

## 2013-12-11 DIAGNOSIS — K047 Periapical abscess without sinus: Secondary | ICD-10-CM | POA: Insufficient documentation

## 2013-12-11 DIAGNOSIS — Z79899 Other long term (current) drug therapy: Secondary | ICD-10-CM | POA: Insufficient documentation

## 2013-12-11 DIAGNOSIS — K029 Dental caries, unspecified: Secondary | ICD-10-CM | POA: Insufficient documentation

## 2013-12-11 DIAGNOSIS — R220 Localized swelling, mass and lump, head: Secondary | ICD-10-CM | POA: Insufficient documentation

## 2013-12-11 MED ORDER — HYDROCODONE-ACETAMINOPHEN 5-325 MG PO TABS: 1.0000 | ORAL_TABLET | ORAL | Status: DC | PRN

## 2013-12-11 MED ORDER — PENICILLIN V POTASSIUM 500 MG PO TABS: 500.0000 mg | ORAL_TABLET | Freq: Four times a day (QID) | ORAL | Status: DC

## 2013-12-11 NOTE — ED Notes (Signed)
Pt reports swelling to left side of face and left eye since yesterday. Had recent dental pain on right side. Airway intact.

## 2013-12-11 NOTE — ED Provider Notes (Signed)
CSN: [id number]     Arrival date & time 12/11/13  1127 History  This chart was scribed for non-physician practitioner Quincy Carnes, PA-C working with Orlie Dakin, MD by Ludger Nutting, ED Scribe. This patient was seen in room TR07C/TR07C and the patient's care was started at 11:46 AM.    Chief Complaint  Patient presents with  . Facial Swelling      The history is provided by the patient. No language interpreter was used.    HPI Comments: CHANNEL PAPANDREA is a 28 y.o. female who presents to the Emergency Department complaining of constant, gradually worsening, left sided facial extending just below her left eye.  No eyelid involvement. Patient reports associated, recent dental pain on the right side with similar symptoms.  She does have mild pain along her left upper jaw.  No fever or chills.. She denies any allergies. She denies any difficulty swallowing, speaking, or feelings of SOB.  No intervention tried PTA.  History reviewed. No pertinent past medical history. History reviewed. No pertinent past surgical history. History reviewed. No pertinent family history. History  Substance Use Topics  . Smoking status: Never Smoker   . Smokeless tobacco: Not on file  . Alcohol Use: Yes   OB History   Grav Para Term Preterm Abortions TAB SAB Ect Mult Living                 Review of Systems  HENT: Positive for dental problem and facial swelling.   All other systems reviewed and are negative.     Allergies  Review of patient's allergies indicates no known allergies.  Home Medications   Prior to Admission medications   Medication Sig Start Date End Date Taking? Authorizing Provider  ibuprofen (ADVIL,MOTRIN) 800 MG tablet Take 1 tablet (800 mg total) by mouth 3 (three) times daily. 10/29/13   Idalia Needle. [place], PA-C  oxyCODONE-acetaminophen (PERCOCET) 5-325 MG per tablet Take 1 tablet by mouth every 4 (four) hours as needed for pain. 05/11/13   Tatyana A Kirichenko, PA-C  penicillin  v potassium (VEETID) 500 MG tablet Take 1 tablet (500 mg total) by mouth 3 (three) times daily. 05/11/13   Tatyana A Kirichenko, PA-C   BP 132/86  Pulse 81  Temp(Src) 99 F (37.2 C) (Oral)  Resp 18  Ht 5\' 2"  (1.575 m)  Wt 150 lb (68.04 kg)  BMI 27.43 kg/m2  SpO2 100%  LMP 12/04/2013  Physical Exam  Nursing note and vitals reviewed. Constitutional: She is oriented to person, place, and time. She appears well-developed and well-nourished.  HENT:  Head: Normocephalic and atraumatic.  Mouth/Throat: Uvula is midline, oropharynx is clear and moist and mucous membranes are normal. Abnormal dentition. Dental abscesses and dental caries present. No oropharyngeal exudate, posterior oropharyngeal edema, posterior oropharyngeal erythema or tonsillar abscesses.    Teeth largely in poor dentition, left upper molars tender with large cavities, surrounding gingiva swollen, erythematous, and tender to palpation with concern for dental abscess, handling secretions appropriately, no trismus, no difficulty swallowing or speaking; mild left sided facial swelling without extension into neck; no facial cellulitis or warmth to touch noted  Eyes: Conjunctivae and EOM are normal. Pupils are equal, round, and reactive to light.  Swelling extending under left eye without extension to upper or lower lids; no visual disturbance   Neck: Normal range of motion. Neck supple.  Cardiovascular: Normal rate, regular rhythm and normal heart sounds.   Pulmonary/Chest: Effort normal. No respiratory distress. She has no wheezes.  Musculoskeletal: Normal range of motion.  Neurological: She is alert and oriented to person, place, and time.  Skin: Skin is warm and dry.  Psychiatric: She has a normal mood and affect.    ED Course  Procedures (including critical care time)  DIAGNOSTIC STUDIES: Oxygen Saturation is 100% on RA, normal by my interpretation.    COORDINATION OF CARE: 11:48 AM Discussed treatment plan with pt  at bedside and pt agreed to plan.   Labs Review Labs Reviewed - No data to display  Imaging Review No results found.   EKG Interpretation None      MDM   Final diagnoses:  Abscess, dental  Left facial swelling   28 y.o. F with left sided facial swelling associated with dental pain, onset yesterday which has progressively worsened.  On exam her left upper molars and gums are tender to palpation, she has very poor dentition and several cavities in this area. It appears that her symptoms are due to a dental abscess. She does have mild facial swelling without extension into the neck.  There is no airway compromise or difficulty handling secretions.  She will be started on antibiotics and pain medicine. She is strongly encouraged to followup with a dentist-- referrals and resource guide provided.  Discussed plan with patient, he/she acknowledged understanding and agreed with plan of care.  Return precautions given for new or worsening symptoms.  I personally performed the services described in this documentation, which was scribed in my presence. The recorded information has been reviewed and is accurate.  Larene Pickett, PA-C 12/11/13 [place], PA-C 12/11/13 1409

## 2013-12-11 NOTE — Discharge Instructions (Signed)
Take the prescribed medication as directed. Follow-up with dentist.  Referrals and resource guide provided for you. Return to the ED for new or worsening symptoms.   Emergency Department Resource Guide 1) Find a Doctor and Pay Out of Pocket Although you won't have to find out who is covered by your insurance plan, it is a good idea to ask around and get recommendations. You will then need to call the office and see if the doctor you have chosen will accept you as a new patient and what types of options they offer for patients who are self-pay. Some doctors offer discounts or will set up payment plans for their patients who do not have insurance, but you will need to ask so you aren't surprised when you get to your appointment.  2) Contact Your Local Health Department Not all health departments have doctors that can see patients for sick visits, but many do, so it is worth a call to see if yours does. If you don't know where your local health department is, you can check in your phone book. The CDC also has a tool to help you locate your state's health department, and many state websites also have listings of all of their local health departments.  3) Find a [facility] If your illness is not likely to be very severe or complicated, you may want to try a walk in clinic. These are popping up all over the country in pharmacies, drugstores, and shopping centers. They're usually staffed by nurse practitioners or physician assistants that have been trained to treat common illnesses and complaints. They're usually fairly quick and inexpensive. However, if you have serious medical issues or chronic medical problems, these are probably not your best option.  No Primary Care Doctor: - Call Health Connect at  [phone] - they can help you locate a primary care doctor that  accepts your insurance, provides certain services, etc. - Physician Referral Service- [phone]  Chronic Pain  Problems: Organization         Address  Phone   Notes  [facility]  [phone] Patients need to be referred by their primary care doctor.   Medication Assistance: Organization         Address  Phone   Notes  [facility] Medication [facility] [address] [place], [place] [address] [phone] --Must be a resident of [facility] -- Must have NO insurance coverage whatsoever (no Medicaid/ Medicare, etc.) -- The pt. MUST have a primary care doctor that directs their care regularly and follows them in the community   MedAssist  [phone]   [employer]  [phone]    Agencies that provide inexpensive medical care: Organization         Address  Phone   Notes  [place]  [phone]   [facility]    [phone]   [facility] [place], [place] [address] [phone]   [place] [address] [place] [phone]   Planned Parenthood    [phone]   [facility]    [phone]   [place] and [place] [address], [place] Phone:  [phone], Fax:  [phone] Hours of Operation:  9 am - 6 pm, M-F.  Also accepts Medicaid/Medicare and self-pay.  [facility] for [place] [address], [address],  [place] Phone: [phone], Fax: [phone]. Hours of Operation:  8:30 am - 5:30 pm, M-F.  Also accepts Medicaid and self-pay.  [facility] [place] [address] [place] Phone: [phone]   [place], [place], [place] [phone], Ext. [phone] Mondays & Thursdays: 7-9 AM.  First 15 patients are seen on a first come, first serve basis.    [place] Providers:  Organization         Address  Phone   Notes  [facility] [address],  [phone] Also  accepts self-pay patients.  [facility] [address], [place]  [phone]   [place], [address], [place] [phone]   [facility] [address] [place] [phone]   Lucianne Lei [address], [place]   [phone] Only accepts [place] Access [place] patients after they have their name applied to their card.   Self-Pay (no insurance) in [facility]:  Organization         Address  Phone   Notes  Sickle Cell Patients, [facility] [place] [phone]   [facility]gent Care [place] [phone]   [facility] [place]  [place], [place], [place] [phone]   [facility]/Dr. Osei-Bonsu  [address], [place] or [address], [place] [phone] Phone number for both [place] and [place] locations is the same.  Urgent Medical and [facility] [address] [place] [phone]   [facility] [address], [place] or [address] [phone]   [facility] [address], [place] [phone], phone; [phone], fax Sees patients 1st and 3rd Saturday of every month.  Must not qualify for public or private insurance (i.e. Medicaid, Medicare, [place] Health Choice, Veterans' Benefits)  Household income should be no more than 200% of the poverty level The clinic cannot treat you if you are pregnant or think you are pregnant  Sexually transmitted diseases are not treated at the clinic.    Dental Care: Organization         Address  Phone  Notes  [facility] [place] [phone] Accepts children up to age 62 who are enrolled in [place] or [place]; pregnant  women with a Medicaid card; and children who have applied for Medicaid or [place] Health Choice, but were declined, whose parents can pay a reduced fee at time of service.  [facility]  [address], [place] [phone] Accepts children up to age 60 who are enrolled in [place] or [place]; pregnant women with a Medicaid card; and children who have applied for Medicaid or [place] Health Choice, but were declined, whose parents can pay a reduced fee at time of service.  [place] Access PROGRAM  [place] [phone] Patients are seen by appointment only. Walk-ins are not accepted. [place] will see patients 15 years of age and older. Monday - Tuesday (8am-5pm) Most Wednesdays (8:30-5pm) $30 per visit, cash only  [place]  [place]  [address], [facility] [phone] Patients are seen by appointment only. Walk-ins are not accepted. [place] will see patients 69 years of age and older. One Wednesday Evening (Monthly: Volunteer Based).  $30 per visit, cash only  [place]  [phone] for adults; Children under age 84, call Graduate Pediatric Dentistry at [phone]. Children aged 22-14, please call [phone] to request a pediatric application.  Dental services are provided in all areas of dental care including fillings, crowns and bridges, complete and partial dentures, implants, gum treatment, root canals, and extractions. Preventive care is also provided. Treatment is provided to both adults and children. Patients are selected via a lottery and there is often a waiting list.   [facility] [address] [place]  [phone] [email or web address]   [facility] [address] [place], [place] [phone], Ext. [phone] Second and Fourth Thursday of each month, opens at 6:30 AM; Clinic ends at 9 AM.  Patients are  seen on a first-come first-served basis, and a limited number are seen during each clinic.   [facility]  [address] [place] [place], [place] [phone]   Eligibility Requirements You must have lived in [place], [place], or [place] counties for at least the last three months.   You cannot be eligible for state or federal sponsored [employer], including [employer], [place], or [employer].   You generally cannot be eligible for healthcare insurance through your employer.    How to apply: Eligibility screenings are held every Tuesday and Wednesday afternoon from 1:00 pm until 4:00 pm. You do not need an appointment for the interview!  [facility] [address] [place], [place]   [place]  [place] Department  [place]  [phone]    Behavioral Health Resources in the Community: Intensive Outpatient Programs Organization         Address  Phone  Notes  [place]. [address], [place], [place] [phone]   [facility] Outpatient [address], [place], [place]   ADS: Alcohol & Drug Svcs [address] [place], [place]   [place] [address]  [place], [place] or [phone]   Substance Abuse Resources Organization         Address  Phone  Notes  Alcohol and Drug Services  [phone]   [place]  [phone]   [facility] [place]   Chinita Pester  [phone]   Residential & Outpatient Substance Abuse Program  [phone]   Psychological Services Organization         Address  Phone  Notes  [facility] [place]  [place]  [phone]   [place] [address], [place] or [phone]    Mobile Crisis  Teams Organization         Address  Phone  Notes  [facility], Mobile Crisis Care Unit  [phone]   [facility] Psychotherapeutic Services  [address] [place], [place]   [place] [address] [place] [phone]    Self-Help/Support Groups Organization         Address  Phone             Notes  [place]. of [place] - variety  of support groups  [phone]- [phone] Call for more information  Narcotics Anonymous (NA), [facility] [address], [employer] [place]  2 meetings at this location   Residential Facilities manager         Address  Phone  Notes  [facility] [place],    [place]  [phone]   [facility]  [address], [place] [address], [place], [place]   [place], [place] [phone] Admissions: 8am-3pm M-F  Incentives Substance [place] [address]    [place], [place] [phone]   [facility] [address] [place], [place], [place]   [facility] [address],  [place], [place]   [facility]ight Programs - Intensive Outpatient [address]een [address], [place], [place]   [facility] ([place].) [place].,  [place], [place] [phone] or [phone]   [facility]idential Treatment Services (RTS) [address] [place], [place] Accepts Medicaid  [facility] [place] [address],  [place] [phone] Substance Abuse/Addiction Treatment   [facility] Organization         Address  Phone  Notes  [facility]  [phone]   Domenic Schwab, PhD [address] Arlis [place], [place]   [phone] or [phone]   [facility], [place] [phone]   [facility] [address] [place], [place] [phone]  Insurance/Medicaid/sponsorship through [facility] and Families [address] [place]                                    [place], [place] [phone] [place] [address] [place], [place] [phone]    Dr. Adele Schilder  [phone]   [facility] Dept. 1) [address] [place] 2) [place] 3)  [place] [address], [place] [phone]  [phone]   [place] [phone] or [phone] (After Hours)

## 2013-12-11 NOTE — ED Provider Notes (Signed)
Medical screening examination/treatment/procedure(s) were performed by non-physician practitioner and as supervising physician I was immediately available for consultation/collaboration.   EKG Interpretation None       Orlie Dakin, MD 12/11/13 [phone]

## 2013-12-11 NOTE — ED Notes (Signed)
Refused wheelchair 

## 2013-12-14 ENCOUNTER — Emergency Department (HOSPITAL_COMMUNITY)
Admission: EM | Admit: 2013-12-14 | Discharge: 2013-12-14 | Disposition: A | Discharge status: Home / Self Care | Payer: BC Managed Care – PPO | Attending: Emergency Medicine | Admitting: Emergency Medicine

## 2013-12-14 ENCOUNTER — Encounter (HOSPITAL_COMMUNITY): Payer: Self-pay | Admitting: Emergency Medicine

## 2013-12-14 DIAGNOSIS — Z792 Long term (current) use of antibiotics: Secondary | ICD-10-CM | POA: Insufficient documentation

## 2013-12-14 DIAGNOSIS — L0201 Cutaneous abscess of face: Secondary | ICD-10-CM | POA: Insufficient documentation

## 2013-12-14 DIAGNOSIS — K046 Periapical abscess with sinus: Secondary | ICD-10-CM | POA: Insufficient documentation

## 2013-12-14 DIAGNOSIS — R110 Nausea: Secondary | ICD-10-CM | POA: Insufficient documentation

## 2013-12-14 DIAGNOSIS — L03211 Cellulitis of face: Secondary | ICD-10-CM

## 2013-12-14 DIAGNOSIS — T360X5A Adverse effect of penicillins, initial encounter: Secondary | ICD-10-CM | POA: Insufficient documentation

## 2013-12-14 DIAGNOSIS — F17200 Nicotine dependence, unspecified, uncomplicated: Secondary | ICD-10-CM | POA: Insufficient documentation

## 2013-12-14 MED ORDER — HYDROCODONE-ACETAMINOPHEN 5-325 MG PO TABS: 1.0000 | ORAL_TABLET | ORAL | Status: DC | PRN

## 2013-12-14 MED ORDER — CEPHALEXIN 500 MG PO CAPS: 500.0000 mg | ORAL_CAPSULE | Freq: Four times a day (QID) | ORAL | Status: DC

## 2013-12-14 MED ORDER — ONDANSETRON 4 MG PO TBDP
8.0000 mg | ORAL_TABLET | Freq: Once | ORAL | Status: AC
  Administered 2013-12-14: 8 mg via ORAL
  Filled 2013-12-14: qty 2

## 2013-12-14 NOTE — Discharge Instructions (Signed)
Cellulitis Cellulitis is an infection of the skin and the tissue beneath it. The infected area is usually red and tender. Cellulitis occurs most often in the arms and lower legs.  CAUSES  Cellulitis is caused by bacteria that enter the skin through cracks or cuts in the skin. The most common types of bacteria that cause cellulitis are Staphylococcus and Streptococcus. SYMPTOMS   Redness and warmth.  Swelling.  Tenderness or pain.  Fever. DIAGNOSIS  Your caregiver can usually determine what is wrong based on a physical exam. Blood tests may also be done. TREATMENT  Treatment usually involves taking an antibiotic medicine. HOME CARE INSTRUCTIONS   Take your antibiotics as directed. Finish them even if you start to feel better.  Keep the infected arm or leg elevated to reduce swelling.  Apply a warm cloth to the affected area up to 4 times per day to relieve pain.  Only take over-the-counter or prescription medicines for pain, discomfort, or fever as directed by your caregiver.  Keep all follow-up appointments as directed by your caregiver. SEEK MEDICAL CARE IF:   You notice red streaks coming from the infected area.  Your red area gets larger or turns dark in color.  Your bone or joint underneath the infected area becomes painful after the skin has healed.  Your infection returns in the same area or another area.  You notice a swollen bump in the infected area.  You develop new symptoms. SEEK IMMEDIATE MEDICAL CARE IF:   You have a fever.  You feel very sleepy.  You develop vomiting or diarrhea.  You have a general ill feeling (malaise) with muscle aches and pains. MAKE SURE YOU:   Understand these instructions.  Will watch your condition.  Will get help right away if you are not doing well or get worse. Document Released: 04/07/2005 Document Revised: 12/28/2011 Document Reviewed: 09/13/2011 [facility] Patient Information 2014 [place].  Cellulitis Cellulitis is an infection of the skin and the tissue under the skin. The infected area is usually red and tender. This happens most often in the arms and lower legs. HOME CARE   Take your antibiotic medicine as told. Finish the medicine even if you start to feel better.  Keep the infected arm or leg raised (elevated).  Put a warm cloth on the area up to 4 times per day.  Only take medicines as told by your doctor.  Keep all doctor visits as told. GET HELP RIGHT AWAY IF:   You have a fever.  You feel very sleepy.  You throw up (vomit) or have watery poop (diarrhea).  You feel sick and have muscle aches and pains.  You see red streaks on the skin coming from the infected area.  Your red area gets bigger or turns a dark color.  Your bone or joint under the infected area is painful after the skin heals.  Your infection comes back in the same area or different area.  You have a puffy (swollen) bump in the infected area.  You have new symptoms. MAKE SURE YOU:   Understand these instructions.  Will watch your condition.  Will get help right away if you are not doing well or get worse. Document Released: 12/15/2007 Document Revised: 12/28/2011 Document Reviewed: 09/13/2011 [facility] Patient Information 2014 [place], [place].

## 2013-12-14 NOTE — ED Provider Notes (Signed)
CSN: [id number]     Arrival date & time 12/14/13  1420 History  This chart was scribed for non-physician practitioner Margarita Mail, PA-C working with Babette Relic, MD by Eston Mould, ED Scribe. This patient was seen in room TR04C/TR04C and the patient's care was started at 4:15 PM .   Chief Complaint  Patient presents with  . Medication Reaction   The history is provided by the patient. No language interpreter was used.   HPI Comments: Jean Reynolds is a 28 y.o. female who presents to the Emergency Department complaining of facial swelling with associated sinus pressure that began 5 days ago. She states she was seen 4 days ago and was noted to have a dental infection; she was discharged with Oxycodone and Penicillin; states the Penicillin makes her feel nauseated. She has come to the ED today for continued pain although she has decreased swelling. Pt received a referral during her last visit but denied F/U. She smokes but denies having health problems and states she is otherwise healthy. Denies hx of smoking, nasal pain, fevers, drooling, ear pain and trouble breathing.  History reviewed. No pertinent past medical history. History reviewed. No pertinent past surgical history. No family history on file. History  Substance Use Topics  . Smoking status: Current Every Day Smoker -- 0.50 packs/day    Types: Cigarettes  . Smokeless tobacco: Not on file  . Alcohol Use: Yes   OB History   Grav Para Term Preterm Abortions TAB SAB Ect Mult Living                 Review of Systems  Constitutional: Negative for fever.  HENT: Positive for dental problem and sinus pressure. Negative for congestion, drooling, ear pain and trouble swallowing.   Respiratory: Negative for shortness of breath.   Gastrointestinal: Positive for nausea. Negative for vomiting.  Skin: Negative for color change.   Allergies  Review of patient's allergies indicates no known allergies.  Home Medications    Prior to Admission medications   Medication Sig Start Date End Date Taking? Authorizing Provider  HYDROcodone-acetaminophen (NORCO/VICODIN) 5-325 MG per tablet Take 1 tablet by mouth every 4 (four) hours as needed. 12/11/13  Yes Larene Pickett, PA-C  ibuprofen (ADVIL,MOTRIN) 800 MG tablet Take 1 tablet (800 mg total) by mouth 3 (three) times daily. 10/29/13  Yes Joaquim Lai C. Sanford, PA-C  penicillin v potassium (VEETID) 500 MG tablet Take 500 mg by mouth 4 (four) times daily. Started medication on Tuesday 12-11-13 12/11/13  Yes Larene Pickett, PA-C   BP 144/96  Pulse 81  Temp(Src) 98.8 F (37.1 C) (Oral)  Resp 18  SpO2 100%  LMP 12/04/2013  Physical Exam  Nursing note and vitals reviewed. Constitutional: She is oriented to person, place, and time. She appears well-developed and well-nourished. No distress.  HENT:  Head: Normocephalic and atraumatic.  Right Ear: External ear normal.  Left Ear: External ear normal.  Nose: Nose normal.  Mouth/Throat: Oropharynx is clear and moist. No oropharyngeal exudate.  Dentition is intact. No obvious carries. Ginvigal ereythema and swelling around L upper wisdom but not tender. Fluctuance around the gum. No cracked teeth. Airway is clear. Improved selling from L nasal ala and bridge down to mandible;is exquisitely tender to palpation and warm to the touch.  Eyes: EOM are normal.  Neck: Neck supple. No tracheal deviation present.  Cardiovascular: Normal rate.   Pulmonary/Chest: Effort normal. No respiratory distress.  Musculoskeletal: Normal range of motion.  Neurological:  She is alert and oriented to person, place, and time.  Skin: Skin is warm and dry.  Psychiatric: She has a normal mood and affect. Her behavior is normal.   ED Course  Procedures  DIAGNOSTIC STUDIES: Oxygen Saturation is 100% on RA, normal by my interpretation.    COORDINATION OF CARE: 4:25 PM-Discussed treatment plan which includes discharge pt with Keflex and Zofran. Advised  pt to F/U with dentist and D/C Penicillin. Pt agreed to plan.   Labs Review Labs Reviewed - No data to display  Imaging Review No results found.   EKG Interpretation None     MDM   Final diagnoses:  None    4:46 PM BP 144/96  Pulse 81  Temp(Src) 98.8 F (37.1 C) (Oral)  Resp 18  SpO2 100%  LMP 12/04/2013 Patient returns with resolving facial swelling.  Regarding Her PE I feel that the patient more likely has facial cellulitis. I am going to change her medication from pcn to keflex. Return precautions discussed. Patient / Family / Caregiver informed of clinical course, understand medical decision-making process, and agree with plan.   I personally performed the services described in this documentation, which was scribed in my presence. The recorded information has been reviewed and is accurate.     Margarita Mail, PA-C 12/14/13 1731

## 2013-12-14 NOTE — ED Notes (Signed)
Pt sts she was seen here for facial swelling and put on medicine, was told that the swelling should improve over the next 3 days. sts that the swelling has gotten better but it hasn't gone away. Pt sts that the medicine also makes her very nauseous and she has vomited from it before. sts she is here because she was at work but they sent her home because she can't be around food. Nad, skin warm and dry, resp e/u.

## 2013-12-17 NOTE — ED Provider Notes (Signed)
Medical screening examination/treatment/procedure(s) were performed by non-physician practitioner and as supervising physician I was immediately available for consultation/collaboration.   EKG Interpretation None       Babette Relic, MD 12/17/13 2213

## 2017-05-30 ENCOUNTER — Encounter (HOSPITAL_COMMUNITY): Payer: Self-pay | Admitting: Emergency Medicine

## 2017-05-30 ENCOUNTER — Ambulatory Visit (HOSPITAL_COMMUNITY)
Admission: EM | Admit: 2017-05-30 | Discharge: 2017-05-30 | Disposition: A | Discharge status: Home / Self Care | Payer: BLUE CROSS/BLUE SHIELD | Attending: Emergency Medicine | Admitting: Emergency Medicine

## 2017-05-30 DIAGNOSIS — K529 Noninfective gastroenteritis and colitis, unspecified: Secondary | ICD-10-CM

## 2017-05-30 DIAGNOSIS — R110 Nausea: Secondary | ICD-10-CM

## 2017-05-30 DIAGNOSIS — R197 Diarrhea, unspecified: Secondary | ICD-10-CM | POA: Diagnosis not present

## 2017-05-30 MED ORDER — ONDANSETRON 4 MG PO TBDP
4.0000 mg | ORAL_TABLET | Freq: Once | ORAL | Status: AC
  Administered 2017-05-30: 4 mg via ORAL

## 2017-05-30 MED ORDER — ONDANSETRON HCL 4 MG PO TABS: 4.0000 mg | ORAL_TABLET | Freq: Four times a day (QID) | ORAL | 0 refills | Status: DC

## 2017-05-30 MED ORDER — ONDANSETRON 4 MG PO TBDP
ORAL_TABLET | ORAL | Status: AC
  Filled 2017-05-30: qty 1

## 2017-05-30 NOTE — ED Provider Notes (Signed)
[facility]    CSN: [id number] Arrival date & time: 05/30/17  1446     History   Chief Complaint Chief Complaint  Patient presents with  . Abdominal Pain  . Nausea    HPI Jean Reynolds is a 31 y.o. female.   31 year old female complaining of approximately 6-7 day history of nausea and primarily diarrhea. Diarrhea is described as watery and nonbloody. She initially had vomiting for a day or 2 but that is since stopped. She complains of crampy pain across the [place]. It is intermittent. Often the diarrhea is initiated by attempts at eating or drinking.  Review of systems: Denies fever, chills, breast for symptoms, chest pain, shortness of breath, back or muscle skeletal pain, genitourinary symptoms.      History reviewed. No pertinent past medical history.  There are no active problems to display for this patient.   History reviewed. No pertinent surgical history.  OB History    No data available       Home Medications    Prior to Admission medications   Medication Sig Start Date End Date Taking? Authorizing Provider  cephALEXin (KEFLEX) 500 MG capsule Take 1 capsule (500 mg total) by mouth 4 (four) times daily. 12/14/13   Margarita Mail, PA-C  HYDROcodone-acetaminophen (NORCO/VICODIN) 5-325 MG per tablet Take 1 tablet by mouth every 4 (four) hours as needed. 12/14/13   Margarita Mail, PA-C  ibuprofen (ADVIL,MOTRIN) 800 MG tablet Take 1 tablet (800 mg total) by mouth 3 (three) times daily. 10/29/13   Ignacia Felling, PA-C  ondansetron (ZOFRAN) 4 MG tablet Take 1 tablet (4 mg total) every 6 (six) hours by mouth. 05/30/17   Janne Napoleon, NP  penicillin v potassium (VEETID) 500 MG tablet Take 500 mg by mouth 4 (four) times daily. Started medication on Tuesday 12-11-13 12/11/13   Larene Pickett, PA-C    Family History No family history on file.  Social History Social History   Tobacco Use  . Smoking status: Current Every Day Smoker    Packs/day: 0.30      Types: Cigarettes  . Smokeless tobacco: Never Used  Substance Use Topics  . Alcohol use: Yes  . Drug use: No     Allergies   Patient has no known allergies.   Review of Systems Review of Systems  Constitutional: Negative.   Gastrointestinal: Positive for abdominal pain.  All other systems reviewed and are negative.    Physical Exam Triage Vital Signs ED Triage Vitals  Enc Vitals Group     BP 05/30/17 1459 (!) 150/90     Pulse Rate 05/30/17 1459 83     Resp 05/30/17 1459 16     Temp 05/30/17 1459 98.6 F (37 C)     Temp Source 05/30/17 1459 Oral     SpO2 05/30/17 1459 100 %     Weight 05/30/17 1500 158 lb (71.7 kg)     Height 05/30/17 1500 5\' 1"  (1.549 m)     Head Circumference --      Peak Flow --      Pain Score 05/30/17 1501 6     Pain Loc --      Pain Edu? --      Excl. in [place]? --    No data found.  Updated Vital Signs BP (!) 150/90   Pulse 83   Temp 98.6 F (37 C) (Oral)   Resp 16   Ht 5\' 1"  (1.549 m)   Wt 158 lb (71.7  kg)   LMP 05/26/2017   SpO2 100%   BMI 29.85 kg/m   Visual Acuity Right Eye Distance:   Left Eye Distance:   Bilateral Distance:    Right Eye Near:   Left Eye Near:    Bilateral Near:     Physical Exam  Constitutional: She is oriented to person, place, and time. She appears well-developed and well-nourished.  Non-toxic appearance. She does not appear ill. No distress.  Eyes: EOM are normal.  Neck: Normal range of motion. Neck supple.  Cardiovascular: Normal rate.  Pulmonary/Chest: Effort normal. No respiratory distress.  Abdominal: Bowel sounds are normal. She exhibits no distension, no pulsatile liver and no pulsatile midline mass.  Mild/moderate tenderness across the lower abdomen. No rebound or guarding. No distention.  Musculoskeletal: She exhibits no edema.  Neurological: She is alert and oriented to person, place, and time. She exhibits normal muscle tone.  Skin: Skin is warm and dry. No cyanosis.  Psychiatric: She  has a normal mood and affect.  Nursing note and vitals reviewed.    UC Treatments / Results  Labs (all labs ordered are listed, but only abnormal results are displayed) Labs Reviewed  GASTROINTESTINAL PANEL BY PCR, STOOL (REPLACES STOOL CULTURE)    EKG  EKG Interpretation None       Radiology No results found.  Procedures Procedures (including critical care time)  Medications Ordered in UC Medications  ondansetron (ZOFRAN-ODT) disintegrating tablet 4 mg (4 mg Oral Given 05/30/17 1614)     Initial Impression / Assessment and Plan / UC Course  I have reviewed the triage vital signs and the nursing notes.  Pertinent labs & imaging results that were available during my care of the patient were reviewed by me and considered in my medical decision making (see chart for details).    Take Zofran for nausea. Primarily drink clear liquids. Do not try to eat much. Maybe just occasional cracker her PCP supposed or small amount of rice. Try to collect a stool and bring it in tomorrow. If you are getting much worse, getting weak or fainty and unable to hold fluids down go to the emergency department. Unable to collect stool in the clinic today for future order has been putting.   Final Clinical Impressions(s) / UC Diagnoses   Final diagnoses:  Diarrhea of presumed infectious origin  Gastroenteritis  Nausea    ED Discharge Orders        Ordered    GI Profile, Stool, PCR     05/30/17 1627    ondansetron (ZOFRAN) 4 MG tablet  Every 6 hours     05/30/17 1628       Controlled Substance Prescriptions [place] Controlled Substance Registry consulted? Not Applicable   Janne Napoleon, NP 05/30/17 1630

## 2017-05-30 NOTE — ED Triage Notes (Signed)
PT reports lower abdominal cramping, nausea, sore throat, and headache for 3-4 days. PT just finished her menstrual cycle and assumed symptoms were related, but they have not improved.

## 2017-05-30 NOTE — Discharge Instructions (Signed)
Take Zofran for nausea. Primarily drink clear liquids. Do not try to eat much. Maybe just occasional cracker her PCP supposed or small amount of rice. Try to collect a stool and bring it in tomorrow. If you are getting much worse, getting weak or fainty and unable to hold fluids down go to the emergency department.

## 2018-01-02 ENCOUNTER — Other Ambulatory Visit: Payer: Self-pay | Admitting: Family Medicine

## 2018-01-02 DIAGNOSIS — E049 Nontoxic goiter, unspecified: Secondary | ICD-10-CM

## 2019-02-16 ENCOUNTER — Encounter (HOSPITAL_COMMUNITY): Payer: Self-pay | Admitting: Emergency Medicine

## 2019-02-16 ENCOUNTER — Emergency Department (HOSPITAL_COMMUNITY): Payer: BC Managed Care – PPO

## 2019-02-16 ENCOUNTER — Emergency Department (HOSPITAL_COMMUNITY)
Admission: EM | Admit: 2019-02-16 | Discharge: 2019-02-16 | Disposition: A | Discharge status: Home / Self Care | Payer: BC Managed Care – PPO | Attending: Emergency Medicine | Admitting: Emergency Medicine

## 2019-02-16 DIAGNOSIS — B9689 Other specified bacterial agents as the cause of diseases classified elsewhere: Secondary | ICD-10-CM

## 2019-02-16 DIAGNOSIS — R112 Nausea with vomiting, unspecified: Secondary | ICD-10-CM | POA: Insufficient documentation

## 2019-02-16 DIAGNOSIS — F17210 Nicotine dependence, cigarettes, uncomplicated: Secondary | ICD-10-CM | POA: Insufficient documentation

## 2019-02-16 DIAGNOSIS — Z3202 Encounter for pregnancy test, result negative: Secondary | ICD-10-CM | POA: Diagnosis not present

## 2019-02-16 DIAGNOSIS — D259 Leiomyoma of uterus, unspecified: Secondary | ICD-10-CM | POA: Insufficient documentation

## 2019-02-16 DIAGNOSIS — N760 Acute vaginitis: Secondary | ICD-10-CM | POA: Insufficient documentation

## 2019-02-16 DIAGNOSIS — F1290 Cannabis use, unspecified, uncomplicated: Secondary | ICD-10-CM | POA: Insufficient documentation

## 2019-02-16 DIAGNOSIS — R1012 Left upper quadrant pain: Secondary | ICD-10-CM | POA: Diagnosis present

## 2019-02-16 DIAGNOSIS — R1013 Epigastric pain: Secondary | ICD-10-CM | POA: Diagnosis not present

## 2019-02-16 LAB — URINALYSIS, ROUTINE W REFLEX MICROSCOPIC
Bacteria, UA: NONE SEEN
Bilirubin Urine: NEGATIVE
Glucose, UA: NEGATIVE mg/dL
Ketones, ur: 80 mg/dL — AB
Leukocytes,Ua: NEGATIVE
Nitrite: NEGATIVE
Protein, ur: 30 mg/dL — AB
Specific Gravity, Urine: 1.023 (ref 1.005–1.030)
pH: 6 (ref 5.0–8.0)

## 2019-02-16 LAB — RAPID URINE DRUG SCREEN, HOSP PERFORMED
Amphetamines: NOT DETECTED
Barbiturates: NOT DETECTED
Benzodiazepines: NOT DETECTED
Cocaine: NOT DETECTED
Opiates: NOT DETECTED
Tetrahydrocannabinol: POSITIVE — AB

## 2019-02-16 LAB — COMPREHENSIVE METABOLIC PANEL
ALT: 58 U/L — ABNORMAL HIGH (ref 0–44)
AST: 48 U/L — ABNORMAL HIGH (ref 15–41)
Albumin: 3.8 g/dL (ref 3.5–5.0)
Alkaline Phosphatase: 114 U/L (ref 38–126)
Anion gap: 10 (ref 5–15)
BUN: 15 mg/dL (ref 6–20)
CO2: 21 mmol/L — ABNORMAL LOW (ref 22–32)
Calcium: 8.9 mg/dL (ref 8.9–10.3)
Chloride: 107 mmol/L (ref 98–111)
Creatinine, Ser: 0.78 mg/dL (ref 0.44–1.00)
GFR calc Af Amer: >60 mL/min (ref >60)
GFR calc non Af Amer: >60 mL/min (ref >60)
Glucose, Bld: 112 mg/dL — ABNORMAL HIGH (ref 70–99)
Potassium: 3.8 mmol/L (ref 3.5–5.1)
Sodium: 138 mmol/L (ref 135–145)
Total Bilirubin: 0.8 mg/dL (ref 0.3–1.2)
Total Protein: 7.3 g/dL (ref 6.5–8.1)

## 2019-02-16 LAB — CBC
HCT: 36.4 % (ref 36.0–46.0)
Hemoglobin: 12.4 g/dL (ref 12.0–15.0)
MCH: 33.7 pg (ref 26.0–34.0)
MCHC: 34.1 g/dL (ref 30.0–36.0)
MCV: 98.9 fL (ref 80.0–100.0)
Platelets: 217 10*3/uL (ref 150–400)
RBC: 3.68 MIL/uL — ABNORMAL LOW (ref 3.87–5.11)
RDW: 13.2 % (ref 11.5–15.5)
WBC: 6.8 10*3/uL (ref 4.0–10.5)
nRBC: 0 % (ref 0.0–0.2)

## 2019-02-16 LAB — WET PREP, GENITAL
Sperm: NONE SEEN
Trich, Wet Prep: NONE SEEN
Yeast Wet Prep HPF POC: NONE SEEN

## 2019-02-16 LAB — LACTIC ACID, PLASMA: Lactic Acid, Venous: 1.1 mmol/L (ref 0.5–1.9)

## 2019-02-16 LAB — I-STAT BETA HCG BLOOD, ED (MC, WL, AP ONLY): I-stat hCG, quantitative: <5 m[IU]/mL (ref <5)

## 2019-02-16 LAB — LIPASE, BLOOD: Lipase: 43 U/L (ref 11–51)

## 2019-02-16 MED ORDER — IOHEXOL 300 MG/ML  SOLN
100.0000 mL | Freq: Once | INTRAMUSCULAR | Status: AC | PRN
  Administered 2019-02-16: 18:00:00 100 mL via INTRAVENOUS

## 2019-02-16 MED ORDER — HYDROMORPHONE HCL 1 MG/ML IJ SOLN
0.5000 mg | Freq: Once | INTRAMUSCULAR | Status: AC
  Administered 2019-02-16: 16:00:00 0.5 mg via INTRAVENOUS
  Filled 2019-02-16: qty 1

## 2019-02-16 MED ORDER — METRONIDAZOLE 500 MG PO TABS: 500.0000 mg | ORAL_TABLET | Freq: Two times a day (BID) | ORAL | 0 refills | Status: DC

## 2019-02-16 MED ORDER — SODIUM CHLORIDE 0.9% FLUSH: 3.0000 mL | Freq: Once | INTRAVENOUS | Status: DC

## 2019-02-16 MED ORDER — DICYCLOMINE HCL 10 MG PO CAPS
10.0000 mg | ORAL_CAPSULE | Freq: Once | ORAL | Status: AC
  Administered 2019-02-16: 20:00:00 10 mg via ORAL
  Filled 2019-02-16: qty 1

## 2019-02-16 MED ORDER — FENTANYL CITRATE (PF) 100 MCG/2ML IJ SOLN
50.0000 ug | Freq: Once | INTRAMUSCULAR | Status: AC
  Administered 2019-02-16: 17:00:00 50 ug via INTRAVENOUS
  Filled 2019-02-16: qty 2

## 2019-02-16 MED ORDER — ONDANSETRON 4 MG PO TBDP: 4.0000 mg | ORAL_TABLET | Freq: Three times a day (TID) | ORAL | 0 refills | Status: DC | PRN

## 2019-02-16 MED ORDER — HYDROCODONE-ACETAMINOPHEN 5-325 MG PO TABS: 1.0000 | ORAL_TABLET | Freq: Four times a day (QID) | ORAL | 0 refills | Status: DC | PRN

## 2019-02-16 MED ORDER — DICYCLOMINE HCL 20 MG PO TABS: 20.0000 mg | ORAL_TABLET | Freq: Two times a day (BID) | ORAL | 0 refills | Status: DC

## 2019-02-16 MED ORDER — ONDANSETRON HCL 4 MG/2ML IJ SOLN
4.0000 mg | Freq: Once | INTRAMUSCULAR | Status: AC
  Administered 2019-02-16: 15:00:00 4 mg via INTRAVENOUS
  Filled 2019-02-16: qty 2

## 2019-02-16 MED ORDER — FENTANYL CITRATE (PF) 100 MCG/2ML IJ SOLN
50.0000 ug | Freq: Once | INTRAMUSCULAR | Status: AC
  Administered 2019-02-16: 50 ug via INTRAVENOUS
  Filled 2019-02-16: qty 2

## 2019-02-16 NOTE — Discharge Instructions (Signed)
Take the medications as prescribed.  Return for any worsening symptoms.

## 2019-02-16 NOTE — ED Notes (Signed)
Pt asking for more pain med

## 2019-02-16 NOTE — ED Notes (Signed)
The pt just returned from  c-t 

## 2019-02-16 NOTE — ED Notes (Signed)
Pt asleep.

## 2019-02-16 NOTE — ED Triage Notes (Signed)
abd pain left x 2 days vomiting also she states. Last bm today normal , denies vag d/c saw her dr  Mikey Kirschner states was not given any rx just started her period 4 fdays ago

## 2019-02-16 NOTE — ED Notes (Signed)
Patient verbalizes understanding of discharge instructions. Opportunity for questioning and answers were provided. Armband removed by staff, pt discharged from ED ambulatory to home.  

## 2019-02-16 NOTE — ED Notes (Signed)
To ct

## 2019-02-16 NOTE — ED Provider Notes (Signed)
[facility] Provider Note   CSN: [id number] Arrival date & time: 02/16/19  1318     History   Chief Complaint Chief Complaint  Patient presents with  . Abdominal Pain    HPI Jean Reynolds is a 33 y.o. female who presents emergency department with chief complaint of left upper quadrant abdominal pain.  Patient states that she had onset of severe pain in her left upper quadrant for the past 2 days with associated vomiting, nausea.  She denies constipation or diarrhea.  She states that she saw her primary care doctor yesterday but they did not give her any prescription she has not been able to hold anything down.  She denies a history of the same previously.  She denies any vaginal or urinary symptoms.  She denies flank pain, fever or chills.  She is a daily marijuana smoker     HPI  History reviewed. No pertinent past medical history.  There are no active problems to display for this patient.   History reviewed. No pertinent surgical history.   OB History   No obstetric history on file.      Home Medications    Prior to Admission medications   Medication Sig Start Date End Date Taking? Authorizing Provider  cephALEXin (KEFLEX) 500 MG capsule Take 1 capsule (500 mg total) by mouth 4 (four) times daily. 12/14/13   Margarita Mail, PA-C  HYDROcodone-acetaminophen (NORCO/VICODIN) 5-325 MG per tablet Take 1 tablet by mouth every 4 (four) hours as needed. 12/14/13   Margarita Mail, PA-C  ibuprofen (ADVIL,MOTRIN) 800 MG tablet Take 1 tablet (800 mg total) by mouth 3 (three) times daily. 10/29/13   Ignacia Felling, PA-C  ondansetron (ZOFRAN) 4 MG tablet Take 1 tablet (4 mg total) every 6 (six) hours by mouth. 05/30/17   Janne Napoleon, NP  penicillin v potassium (VEETID) 500 MG tablet Take 500 mg by mouth 4 (four) times daily. Started medication on Tuesday 12-11-13 12/11/13   Larene Pickett, PA-C    Family History No family history on file.   Social History Social History   Tobacco Use  . Smoking status: Current Every Day Smoker    Packs/day: 0.30    Types: Cigarettes  . Smokeless tobacco: Never Used  Substance Use Topics  . Alcohol use: Yes  . Drug use: Yes    Types: Marijuana     Allergies   Patient has no known allergies.   Review of Systems Review of Systems Ten systems reviewed and are negative for acute change, except as noted in the HPI.    Physical Exam Updated Vital Signs BP (!) 148/77   Pulse 96   Temp 98.7 F (37.1 C) (Oral)   Resp 20   SpO2 100%   Physical Exam Vitals signs and nursing note reviewed.  Constitutional:      Appearance: She is well-developed.     Comments: Patient is pacing next to the bed walking back and forth.  She is holding her left upper quadrant, crying loudly.  HENT:     Head: Normocephalic and atraumatic.  Eyes:     General: No scleral icterus.    Conjunctiva/sclera: Conjunctivae normal.  Neck:     Musculoskeletal: Normal range of motion.  Cardiovascular:     Rate and Rhythm: Normal rate and regular rhythm.     Heart sounds: Normal heart sounds. No murmur. No friction rub. No gallop.   Pulmonary:     Effort: Pulmonary effort is  normal. No respiratory distress.     Breath sounds: Normal breath sounds.  Abdominal:     General: Bowel sounds are normal. There is no distension.     Palpations: Abdomen is soft. There is no mass.     Tenderness: There is no abdominal tenderness. There is no right CVA tenderness, left CVA tenderness or guarding.     Comments: Abdomen is soft and nontender.  No CVA tenderness.  Skin:    General: Skin is warm.  Neurological:     Mental Status: She is alert and oriented to person, place, and time.  Psychiatric:        Behavior: Behavior normal.      ED Treatments / Results  Labs (all labs ordered are listed, but only abnormal results are displayed) Labs Reviewed  LIPASE, BLOOD  COMPREHENSIVE METABOLIC PANEL  CBC  URINALYSIS,  ROUTINE W REFLEX MICROSCOPIC  I-STAT BETA HCG BLOOD, ED ([facility], [facility], [facility] ONLY)    EKG None  Radiology No results found.  Procedures Procedures (including critical care time)  Medications Ordered in ED Medications  sodium chloride flush (NS) 0.9 % injection 3 mL (has no administration in time range)     Initial Impression / Assessment and Plan / ED Course  I have reviewed the triage vital signs and the nursing notes.  Pertinent labs & imaging results that were available during my care of the patient were reviewed by me and considered in my medical decision making (see chart for details).  Clinical Course as of Feb 20 1638  Fri Feb 16, 2019  1624 AST(!): 48 [AH]  1625 ALT(!): 58 [AH]    Clinical Course User Index [AH] Margarita Mail, PA-C       Patient here with left lower quadrant abdominal pain.Differential diagnosis of her lower abdominal considerations include pelvic inflammatory disease, ectopic pregnancy, appendicitis, urinary calculi, primary dysmenorrhea, septic abortion, ruptured ovarian cyst or tumor, ovarian torsion, tubo-ovarian abscess, degeneration of fibroid, endometriosis, diverticulitis, cystitis.   Her wet prep shows some BV.  Urinalysis is without significant abnormality.  Her UDS is negative.  Lactic acid WNL. CBC without significant abnormality Negative pregnancy and lipase. CMP shows slightly elevated AST and ALT, bicarb 21 to just below normal.  Glucose slightly elevated patient CT scan shows no abnormalities.  I personally reviewed these images.  There was a 2 cm cyst on the right and a peripheral ovarian cyst versus hydrosalpinx on the left.  I the patient for ultrasound for further evaluation which shows no evidence of torsion a 2 cm fibroid in the uterus and multiple small thyroid follicles in both ovaries without hydrosalpinx.  Patient is feeling much better.  Her pain and nausea have been controlled.  She has no active vomiting and she appears appropriate  for discharge at this time  Final Clinical Impressions(s) / ED Diagnoses   Final diagnoses:  Epigastric pain  Non-intractable vomiting with nausea, unspecified vomiting type  Bacterial vaginosis    ED Discharge Orders    None       Margarita Mail, PA-C 02/21/19 1642    Drenda Freeze, MD 02/22/19 1452

## 2019-02-16 NOTE — ED Notes (Signed)
Patient back from Ultrasound.

## 2019-02-16 NOTE — ED Provider Notes (Signed)
Transferred from [place], PA-C at shift transfer.  Patient with complaint of left upper abdominal pain.  Has had associated nausea with nonbilious emesis.  Seen by PCP yesterday however did not have any prescriptions given to her.  Is a daily marijuana smoker.  Labs personally reviewed.  Wet prep with BV.  Will DC home with Flagyl.  No concern for PID per previous providers exam.  UDS positive for THC, CBC without leukocytosis, to [facility] panel with mild hyperglycemia at 112, noticed electrolyte, renal or liver abnormality, lipase 43, pregnancy test negative.  CT abdomen pelvis with low right ovary.  Recommend ultrasound.  Patient's pain has been controlled in the ED.  She is tolerating p.o.  Care transferred pending ultrasound.  Ultrasound negative for torsion.  She does have a fibroid uterus.  Possible gastritis versus THC hyperemesis.  Will DC home with analgesia, Zofran and have her follow-up with PCP.  Patient return for any new or worsening symptoms.  Patient is nontoxic, nonseptic appearing, in no apparent distress.  Patient's pain and other symptoms adequately managed in emergency department.  Fluid bolus given.  Labs, imaging and vitals reviewed.  Patient does not meet the SIRS or Sepsis criteria.  On repeat exam patient does not have a surgical abdomin and there are no peritoneal signs.  No indication of appendicitis, bowel obstruction, bowel perforation, cholecystitis, diverticulitis, PID, TOA, torsion or ectopic pregnancy.  Patient discharged home with symptomatic treatment and given strict instructions for follow-up with their primary care physician.  I have also discussed reasons to return immediately to the ER.  Patient expresses understanding and agrees with plan.  Labs Reviewed  WET PREP, GENITAL - Abnormal; Notable for the following components:      Result Value   Clue Cells Wet Prep HPF POC PRESENT (*)    WBC, Wet Prep HPF POC FEW (*)    All other components within normal limits   COMPREHENSIVE METABOLIC PANEL - Abnormal; Notable for the following components:   CO2 21 (*)    Glucose, Bld 112 (*)    AST 48 (*)    ALT 58 (*)    All other components within normal limits  URINALYSIS, ROUTINE W REFLEX MICROSCOPIC - Abnormal; Notable for the following components:   APPearance HAZY (*)    Hgb urine dipstick SMALL (*)    Ketones, ur 80 (*)    Protein, ur 30 (*)    All other components within normal limits  RAPID URINE DRUG SCREEN, HOSP PERFORMED - Abnormal; Notable for the following components:   Tetrahydrocannabinol POSITIVE (*)    All other components within normal limits  CBC - Abnormal; Notable for the following components:   RBC 3.68 (*)    All other components within normal limits  LIPASE, BLOOD  LACTIC ACID, PLASMA  I-STAT BETA HCG BLOOD, ED ([facility], [facility], [facility] ONLY)  GC/CHLAMYDIA PROBE AMP ([place]) NOT AT [facility]   US Transvaginal Non-ob  Result Date: 02/16/2019 CLINICAL DATA:  Left upper quadrant abdominal pain. EXAM: TRANSABDOMINAL AND TRANSVAGINAL ULTRASOUND OF PELVIS DOPPLER ULTRASOUND OF OVARIES TECHNIQUE: Both transabdominal and transvaginal ultrasound examinations of the pelvis were performed. Transabdominal technique was performed for global imaging of the pelvis including uterus, ovaries, adnexal regions, and pelvic cul-de-sac. It was necessary to proceed with endovaginal exam following the transabdominal exam to visualize the endometrium and ovaries. Color and duplex Doppler ultrasound was utilized to evaluate blood flow to the ovaries. COMPARISON:  CT scan February 16, 2019 FINDINGS: Uterus Measurements: 6.7  x 4.1 x 4.7 cm = volume: 67 mL. Contains a 2.7 x 2.3 x 2.3 cm fibroid. Endometrium Thickness: 6.5 mm.  No focal abnormality visualized. Right ovary Measurements: 3.6 x 2.3 x 2.4 cm = volume: 9.3 mL. Contains multiple follicles with the largest measuring 1.8 cm. Left ovary Measurements: 3.2 x 2.1 x 2.9 cm = volume: 10.0 mL. Contains multiple small follicles.  Pulsed Doppler evaluation of both ovaries demonstrates normal low-resistance arterial and venous waveforms. Other findings No abnormal free fluid. IMPRESSION: 1. There is a 2.7 cm fibroid in the uterus. 2. Multiple small follicles in both ovaries. The ovaries are otherwise normal. No hydrosalpinx on the left. Electronically Signed   By: Dorise Bullion III M.D   On: 02/16/2019 19:35   US Pelvis Complete  Result Date: 02/16/2019 CLINICAL DATA:  Left upper quadrant abdominal pain. EXAM: TRANSABDOMINAL AND TRANSVAGINAL ULTRASOUND OF PELVIS DOPPLER ULTRASOUND OF OVARIES TECHNIQUE: Both transabdominal and transvaginal ultrasound examinations of the pelvis were performed. Transabdominal technique was performed for global imaging of the pelvis including uterus, ovaries, adnexal regions, and pelvic cul-de-sac. It was necessary to proceed with endovaginal exam following the transabdominal exam to visualize the endometrium and ovaries. Color and duplex Doppler ultrasound was utilized to evaluate blood flow to the ovaries. COMPARISON:  CT scan February 16, 2019 FINDINGS: Uterus Measurements: 6.7 x 4.1 x 4.7 cm = volume: 67 mL. Contains a 2.7 x 2.3 x 2.3 cm fibroid. Endometrium Thickness: 6.5 mm.  No focal abnormality visualized. Right ovary Measurements: 3.6 x 2.3 x 2.4 cm = volume: 9.3 mL. Contains multiple follicles with the largest measuring 1.8 cm. Left ovary Measurements: 3.2 x 2.1 x 2.9 cm = volume: 10.0 mL. Contains multiple small follicles. Pulsed Doppler evaluation of both ovaries demonstrates normal low-resistance arterial and venous waveforms. Other findings No abnormal free fluid. IMPRESSION: 1. There is a 2.7 cm fibroid in the uterus. 2. Multiple small follicles in both ovaries. The ovaries are otherwise normal. No hydrosalpinx on the left. Electronically Signed   By: Dorise Bullion III M.D   On: 02/16/2019 19:35   Ct Abdomen Pelvis W Contrast  Result Date: 02/16/2019 CLINICAL DATA:  Abdominal pain for 2 days.  EXAM: CT ABDOMEN AND PELVIS WITH CONTRAST TECHNIQUE: Multidetector CT imaging of the abdomen and pelvis was performed using the standard protocol following bolus administration of intravenous contrast. CONTRAST:  186mL OMNIPAQUE IOHEXOL 300 MG/ML  SOLN COMPARISON:  None. FINDINGS: Lower chest: No acute abnormality. Hepatobiliary: No focal liver abnormality is seen. No gallstones, gallbladder wall thickening, or biliary dilatation. Pancreas: Unremarkable. No pancreatic ductal dilatation or surrounding inflammatory changes. Spleen: Normal in size without focal abnormality. Adrenals/Urinary Tract: Adrenal glands are unremarkable. Bilateral subcentimeter renal cysts. Kidneys are otherwise without renal calculi, focal lesion, or hydronephrosis. Bladder is unremarkable. Stomach/Bowel: Stomach is within normal limits. Appendix appears normal. No evidence of bowel wall thickening, distention, or inflammatory changes. Vascular/Lymphatic: No significant vascular findings are present. No enlarged abdominal or pelvic lymph nodes. Reproductive: Low positioned right ovary with several less than 2 cm cysts. Adjacent peripheral ovarian cysts versus hydrosalpinx in the left adnexa. Normal appearance of the uterus. Other: No abdominal wall hernia or abnormality. No abdominopelvic ascites. Musculoskeletal: No acute or significant osseous findings. IMPRESSION: 1. No evidence of acute abnormalities within the solid abdominal organs. 2. Bilateral subcentimeter renal cysts. 3. Low positioned right ovary with several less than 2 cm cysts. Adjacent peripheral ovarian cysts versus hydrosalpinx in the left adnexa. Further evaluation with pelvic ultrasound,  to include Doppler evaluation, may be considered. Electronically Signed   By: Fidela Salisbury M.D.   On: 02/16/2019 18:33   [place] Art/ven Flow Abd Pelv Doppler  Result Date: 02/16/2019 CLINICAL DATA:  Left upper quadrant abdominal pain. EXAM: TRANSABDOMINAL AND TRANSVAGINAL ULTRASOUND  OF PELVIS DOPPLER ULTRASOUND OF OVARIES TECHNIQUE: Both transabdominal and transvaginal ultrasound examinations of the pelvis were performed. Transabdominal technique was performed for global imaging of the pelvis including uterus, ovaries, adnexal regions, and pelvic cul-de-sac. It was necessary to proceed with endovaginal exam following the transabdominal exam to visualize the endometrium and ovaries. Color and duplex Doppler ultrasound was utilized to evaluate blood flow to the ovaries. COMPARISON:  CT scan February 16, 2019 FINDINGS: Uterus Measurements: 6.7 x 4.1 x 4.7 cm = volume: 67 mL. Contains a 2.7 x 2.3 x 2.3 cm fibroid. Endometrium Thickness: 6.5 mm.  No focal abnormality visualized. Right ovary Measurements: 3.6 x 2.3 x 2.4 cm = volume: 9.3 mL. Contains multiple follicles with the largest measuring 1.8 cm. Left ovary Measurements: 3.2 x 2.1 x 2.9 cm = volume: 10.0 mL. Contains multiple small follicles. Pulsed Doppler evaluation of both ovaries demonstrates normal low-resistance arterial and venous waveforms. Other findings No abnormal free fluid. IMPRESSION: 1. There is a 2.7 cm fibroid in the uterus. 2. Multiple small follicles in both ovaries. The ovaries are otherwise normal. No hydrosalpinx on the left. Electronically Signed   By: Dorise Bullion III M.D   On: 02/16/2019 19:35     Henderly, Britni A, PA-C 02/16/19 Gwenevere Abbot, MD 02/17/19 1451

## 2019-02-18 ENCOUNTER — Encounter (HOSPITAL_COMMUNITY): Payer: Self-pay | Admitting: Emergency Medicine

## 2019-02-18 ENCOUNTER — Other Ambulatory Visit: Payer: Self-pay

## 2019-02-18 ENCOUNTER — Emergency Department (HOSPITAL_COMMUNITY)
Admission: EM | Admit: 2019-02-18 | Discharge: 2019-02-18 | Disposition: A | Discharge status: Home / Self Care | Payer: BLUE CROSS/BLUE SHIELD | Attending: Emergency Medicine | Admitting: Emergency Medicine

## 2019-02-18 DIAGNOSIS — F17210 Nicotine dependence, cigarettes, uncomplicated: Secondary | ICD-10-CM | POA: Insufficient documentation

## 2019-02-18 DIAGNOSIS — R1012 Left upper quadrant pain: Secondary | ICD-10-CM | POA: Diagnosis present

## 2019-02-18 DIAGNOSIS — K2900 Acute gastritis without bleeding: Secondary | ICD-10-CM | POA: Diagnosis not present

## 2019-02-18 DIAGNOSIS — R112 Nausea with vomiting, unspecified: Secondary | ICD-10-CM

## 2019-02-18 LAB — COMPREHENSIVE METABOLIC PANEL
ALT: 39 U/L (ref 0–44)
AST: 19 U/L (ref 15–41)
Albumin: 3.9 g/dL (ref 3.5–5.0)
Alkaline Phosphatase: 106 U/L (ref 38–126)
Anion gap: 14 (ref 5–15)
BUN: 13 mg/dL (ref 6–20)
CO2: 20 mmol/L — ABNORMAL LOW (ref 22–32)
Calcium: 9.2 mg/dL (ref 8.9–10.3)
Chloride: 102 mmol/L (ref 98–111)
Creatinine, Ser: 0.73 mg/dL (ref 0.44–1.00)
GFR calc Af Amer: >60 mL/min (ref >60)
GFR calc non Af Amer: >60 mL/min (ref >60)
Glucose, Bld: 115 mg/dL — ABNORMAL HIGH (ref 70–99)
Potassium: 3 mmol/L — ABNORMAL LOW (ref 3.5–5.1)
Sodium: 136 mmol/L (ref 135–145)
Total Bilirubin: 0.6 mg/dL (ref 0.3–1.2)
Total Protein: 7.3 g/dL (ref 6.5–8.1)

## 2019-02-18 LAB — CBC
HCT: 36 % (ref 36.0–46.0)
Hemoglobin: 12.8 g/dL (ref 12.0–15.0)
MCH: 33.9 pg (ref 26.0–34.0)
MCHC: 35.6 g/dL (ref 30.0–36.0)
MCV: 95.2 fL (ref 80.0–100.0)
Platelets: 243 10*3/uL (ref 150–400)
RBC: 3.78 MIL/uL — ABNORMAL LOW (ref 3.87–5.11)
RDW: 12.7 % (ref 11.5–15.5)
WBC: 7.2 10*3/uL (ref 4.0–10.5)
nRBC: 0 % (ref 0.0–0.2)

## 2019-02-18 LAB — URINALYSIS, ROUTINE W REFLEX MICROSCOPIC
Bacteria, UA: NONE SEEN
Bilirubin Urine: NEGATIVE
Glucose, UA: NEGATIVE mg/dL
Hgb urine dipstick: NEGATIVE
Ketones, ur: 80 mg/dL — AB
Nitrite: NEGATIVE
Protein, ur: 30 mg/dL — AB
Specific Gravity, Urine: 1.026 (ref 1.005–1.030)
pH: 5 (ref 5.0–8.0)

## 2019-02-18 LAB — I-STAT BETA HCG BLOOD, ED (MC, WL, AP ONLY): I-stat hCG, quantitative: <5 m[IU]/mL (ref <5)

## 2019-02-18 LAB — LIPASE, BLOOD: Lipase: 31 U/L (ref 11–51)

## 2019-02-18 MED ORDER — LORAZEPAM 2 MG/ML IJ SOLN
0.5000 mg | Freq: Once | INTRAMUSCULAR | Status: AC
  Administered 2019-02-18: 0.5 mg via INTRAVENOUS
  Filled 2019-02-18: qty 1

## 2019-02-18 MED ORDER — HALOPERIDOL LACTATE 5 MG/ML IJ SOLN
2.0000 mg | Freq: Once | INTRAMUSCULAR | Status: AC
  Administered 2019-02-18: 11:00:00 2 mg via INTRAVENOUS
  Filled 2019-02-18: qty 1

## 2019-02-18 MED ORDER — POTASSIUM CHLORIDE 10 MEQ/100ML IV SOLN
10.0000 meq | INTRAVENOUS | Status: AC
  Administered 2019-02-18 (×2): 10 meq via INTRAVENOUS
  Filled 2019-02-18 (×2): qty 100

## 2019-02-18 MED ORDER — METRONIDAZOLE 0.75 % VA GEL: 1.0000 | Freq: Every day | VAGINAL | 0 refills | Status: AC

## 2019-02-18 MED ORDER — SODIUM CHLORIDE 0.9% FLUSH
3.0000 mL | Freq: Once | INTRAVENOUS | Status: AC
  Administered 2019-02-18: 08:00:00 3 mL via INTRAVENOUS

## 2019-02-18 MED ORDER — ONDANSETRON HCL 4 MG/2ML IJ SOLN
4.0000 mg | Freq: Once | INTRAMUSCULAR | Status: AC
  Administered 2019-02-18: 08:00:00 4 mg via INTRAVENOUS
  Filled 2019-02-18: qty 2

## 2019-02-18 MED ORDER — SODIUM CHLORIDE 0.9 % IV BOLUS
2000.0000 mL | Freq: Once | INTRAVENOUS | Status: AC
  Administered 2019-02-18: 08:00:00 2000 mL via INTRAVENOUS

## 2019-02-18 MED ORDER — DICYCLOMINE HCL 10 MG/ML IM SOLN
20.0000 mg | Freq: Once | INTRAMUSCULAR | Status: AC
  Administered 2019-02-18: 20 mg via INTRAMUSCULAR
  Filled 2019-02-18: qty 2

## 2019-02-18 MED ORDER — SUCRALFATE 1 GM/10ML PO SUSP: 1.0000 g | Freq: Three times a day (TID) | ORAL | 0 refills | Status: DC

## 2019-02-18 MED ORDER — CAPSAICIN 0.025 % EX CREA
TOPICAL_CREAM | Freq: Once | CUTANEOUS | Status: AC
  Administered 2019-02-18: 09:00:00 via TOPICAL
  Filled 2019-02-18: qty 60

## 2019-02-18 NOTE — ED Notes (Signed)
Pt states pain increasing quickly.  PA notified.

## 2019-02-18 NOTE — ED Triage Notes (Signed)
Pt c/o abdominal pain, nausea and vomiting. Seen for the same x 4 days ago. Prescribed abx and pain meds, states she is unable to keep meds down.

## 2019-02-18 NOTE — Discharge Instructions (Signed)
Take Zofran ODT as prescribed for your nausea and vomiting.  Make sure not to swallow this medication and let it dissolve under your tongue.  This is the only way it will work.  Take Carafate 4 times daily.  Do not take Norco on an empty stomach.  Begin with clear liquids such as water, Pedialyte, Gatorade, ginger ale.  Do not progress your diet until you are tolerating these.  Begin with bland foods such as saltine crackers, bananas, rice, applesauce, toast.  Avoid greasy, fatty, spicy, fried foods.  Avoid alcohol and marijuana.  Please follow-up with your doctor this week for recheck.  Please return to emergency department if you develop any new or worsening symptoms including fever, severe localized abdominal pain, intractable vomiting, or any other concerning symptoms.

## 2019-02-18 NOTE — ED Provider Notes (Signed)
[facility] EMERGENCY DEPARTMENT Provider Note   CSN: [id number] Arrival date & time: 02/18/19  0603    History   Chief Complaint Chief Complaint  Patient presents with   Abdominal Pain    HPI Jean Reynolds is a 33 y.o. female who is previously healthy who presents with a 5-day history of left upper quadrant pain, nausea, and vomiting.  Patient was evaluated 2 days ago with CT abdomen pelvis and pelvic ultrasound, which showed fibroid uterus and ovarian follicles, however otherwise were negative.  It was thought that she was having gastroenteritis versus cannabis hyperemesis.  Patient was sent home with Bentyl, Zofran, Norco, and Flagyl for BV.  She has not been able to keep the medications down.  She continues to vomit anything she tries to eat or drink.  She is not able to keep down fluids.  She continues to have left upper quadrant pain, nonradiating.  She denies any diarrhea, fever, chest pain, shortness of breath, cough, urinary symptoms, abnormal vaginal bleeding or discharge.  Patient denies any alcohol or marijuana use since she has been sick.     HPI  History reviewed. No pertinent past medical history.  There are no active problems to display for this patient.   History reviewed. No pertinent surgical history.   OB History   No obstetric history on file.      Home Medications    Prior to Admission medications   Medication Sig Start Date End Date Taking? Authorizing Provider  dicyclomine (BENTYL) 20 MG tablet Take 1 tablet (20 mg total) by mouth 2 (two) times daily. 02/16/19  Yes Henderly, Britni A, PA-C  HYDROcodone-acetaminophen (NORCO/VICODIN) 5-325 MG tablet Take 1 tablet by mouth every 6 (six) hours as needed for severe pain. 02/16/19  Yes Henderly, Britni A, PA-C  ondansetron (ZOFRAN ODT) 4 MG disintegrating tablet Take 1 tablet (4 mg total) by mouth every 8 (eight) hours as needed for nausea or vomiting. 02/16/19  Yes Henderly, Britni A, PA-C    cephALEXin (KEFLEX) 500 MG capsule Take 1 capsule (500 mg total) by mouth 4 (four) times daily. Patient not taking: Reported on 02/18/2019 12/14/13   Margarita Mail, PA-C  ibuprofen (ADVIL,MOTRIN) 800 MG tablet Take 1 tablet (800 mg total) by mouth 3 (three) times daily. Patient not taking: Reported on 02/18/2019 10/29/13   Ignacia Felling, PA-C  metroNIDAZOLE (METROGEL VAGINAL) 0.75 % vaginal gel Place 1 Applicatorful vaginally at bedtime for 5 days. 02/18/19 02/23/19  Zanaria Morell, Bea Graff, PA-C  ondansetron (ZOFRAN) 4 MG tablet Take 1 tablet (4 mg total) every 6 (six) hours by mouth. Patient not taking: Reported on 02/18/2019 05/30/17   Janne Napoleon, NP  sucralfate (CARAFATE) 1 GM/10ML suspension Take 10 mLs (1 g total) by mouth 4 (four) times daily -  with meals and at bedtime. 02/18/19   Frederica Kuster, PA-C    Family History No family history on file.  Social History Social History   Tobacco Use   Smoking status: Current Every Day Smoker    Packs/day: 0.30    Types: Cigarettes   Smokeless tobacco: Never Used  Substance Use Topics   Alcohol use: Yes   Drug use: Yes    Types: Marijuana     Allergies   Patient has no known allergies.   Review of Systems Review of Systems  Constitutional: Negative for chills and fever.  HENT: Negative for facial swelling and sore throat.   Respiratory: Negative for shortness of breath.  Cardiovascular: Negative for chest pain.  Gastrointestinal: Positive for abdominal pain, nausea and vomiting.  Genitourinary: Negative for dysuria.  Musculoskeletal: Negative for back pain.  Skin: Negative for rash and wound.  Neurological: Negative for headaches.  Psychiatric/Behavioral: The patient is not nervous/anxious.      Physical Exam Updated Vital Signs BP 122/63    Pulse 89    Temp 98.2 F (36.8 C) (Oral)    Resp (!) 36    SpO2 100%   Physical Exam Vitals signs and nursing note reviewed.  Constitutional:      General: She is not in acute  distress.    Appearance: She is well-developed. She is not diaphoretic.     Comments: Tearful  HENT:     Head: Normocephalic and atraumatic.     Mouth/Throat:     Pharynx: No oropharyngeal exudate.  Eyes:     General: No scleral icterus.       Right eye: No discharge.        Left eye: No discharge.     Conjunctiva/sclera: Conjunctivae normal.     Pupils: Pupils are equal, round, and reactive to light.  Neck:     Musculoskeletal: Normal range of motion and neck supple.     Thyroid: No thyromegaly.  Cardiovascular:     Rate and Rhythm: Normal rate and regular rhythm.     Heart sounds: Normal heart sounds. No murmur. No friction rub. No gallop.   Pulmonary:     Effort: Pulmonary effort is normal. No respiratory distress.     Breath sounds: Normal breath sounds. No stridor. No wheezing or rales.  Abdominal:     General: Bowel sounds are normal. There is no distension.     Palpations: Abdomen is soft.     Tenderness: There is abdominal tenderness (mild) in the left upper quadrant. There is no right CVA tenderness, left CVA tenderness, guarding or rebound.  Lymphadenopathy:     Cervical: No cervical adenopathy.  Skin:    General: Skin is warm and dry.     Coloration: Skin is not pale.     Findings: No rash.  Neurological:     Mental Status: She is alert.     Coordination: Coordination normal.      ED Treatments / Results  Labs (all labs ordered are listed, but only abnormal results are displayed) Labs Reviewed  COMPREHENSIVE METABOLIC PANEL - Abnormal; Notable for the following components:      Result Value   Potassium 3.0 (*)    CO2 20 (*)    Glucose, Bld 115 (*)    All other components within normal limits  CBC - Abnormal; Notable for the following components:   RBC 3.78 (*)    All other components within normal limits  URINALYSIS, ROUTINE W REFLEX MICROSCOPIC - Abnormal; Notable for the following components:   APPearance HAZY (*)    Ketones, ur 80 (*)    Protein,  ur 30 (*)    Leukocytes,Ua TRACE (*)    All other components within normal limits  LIPASE, BLOOD  I-STAT BETA HCG BLOOD, ED ([facility], [facility], [facility] ONLY)    EKG None  Radiology US Transvaginal Non-ob  Result Date: 02/16/2019 CLINICAL DATA:  Left upper quadrant abdominal pain. EXAM: TRANSABDOMINAL AND TRANSVAGINAL ULTRASOUND OF PELVIS DOPPLER ULTRASOUND OF OVARIES TECHNIQUE: Both transabdominal and transvaginal ultrasound examinations of the pelvis were performed. Transabdominal technique was performed for global imaging of the pelvis including uterus, ovaries, adnexal regions, and pelvic cul-de-sac. It  was necessary to proceed with endovaginal exam following the transabdominal exam to visualize the endometrium and ovaries. Color and duplex Doppler ultrasound was utilized to evaluate blood flow to the ovaries. COMPARISON:  CT scan February 16, 2019 FINDINGS: Uterus Measurements: 6.7 x 4.1 x 4.7 cm = volume: 67 mL. Contains a 2.7 x 2.3 x 2.3 cm fibroid. Endometrium Thickness: 6.5 mm.  No focal abnormality visualized. Right ovary Measurements: 3.6 x 2.3 x 2.4 cm = volume: 9.3 mL. Contains multiple follicles with the largest measuring 1.8 cm. Left ovary Measurements: 3.2 x 2.1 x 2.9 cm = volume: 10.0 mL. Contains multiple small follicles. Pulsed Doppler evaluation of both ovaries demonstrates normal low-resistance arterial and venous waveforms. Other findings No abnormal free fluid. IMPRESSION: 1. There is a 2.7 cm fibroid in the uterus. 2. Multiple small follicles in both ovaries. The ovaries are otherwise normal. No hydrosalpinx on the left. Electronically Signed   By: Dorise Bullion III M.D   On: 02/16/2019 19:35   US Pelvis Complete  Result Date: 02/16/2019 CLINICAL DATA:  Left upper quadrant abdominal pain. EXAM: TRANSABDOMINAL AND TRANSVAGINAL ULTRASOUND OF PELVIS DOPPLER ULTRASOUND OF OVARIES TECHNIQUE: Both transabdominal and transvaginal ultrasound examinations of the pelvis were performed. Transabdominal  technique was performed for global imaging of the pelvis including uterus, ovaries, adnexal regions, and pelvic cul-de-sac. It was necessary to proceed with endovaginal exam following the transabdominal exam to visualize the endometrium and ovaries. Color and duplex Doppler ultrasound was utilized to evaluate blood flow to the ovaries. COMPARISON:  CT scan February 16, 2019 FINDINGS: Uterus Measurements: 6.7 x 4.1 x 4.7 cm = volume: 67 mL. Contains a 2.7 x 2.3 x 2.3 cm fibroid. Endometrium Thickness: 6.5 mm.  No focal abnormality visualized. Right ovary Measurements: 3.6 x 2.3 x 2.4 cm = volume: 9.3 mL. Contains multiple follicles with the largest measuring 1.8 cm. Left ovary Measurements: 3.2 x 2.1 x 2.9 cm = volume: 10.0 mL. Contains multiple small follicles. Pulsed Doppler evaluation of both ovaries demonstrates normal low-resistance arterial and venous waveforms. Other findings No abnormal free fluid. IMPRESSION: 1. There is a 2.7 cm fibroid in the uterus. 2. Multiple small follicles in both ovaries. The ovaries are otherwise normal. No hydrosalpinx on the left. Electronically Signed   By: Dorise Bullion III M.D   On: 02/16/2019 19:35   Ct Abdomen Pelvis W Contrast  Result Date: 02/16/2019 CLINICAL DATA:  Abdominal pain for 2 days. EXAM: CT ABDOMEN AND PELVIS WITH CONTRAST TECHNIQUE: Multidetector CT imaging of the abdomen and pelvis was performed using the standard protocol following bolus administration of intravenous contrast. CONTRAST:  153mL OMNIPAQUE IOHEXOL 300 MG/ML  SOLN COMPARISON:  None. FINDINGS: Lower chest: No acute abnormality. Hepatobiliary: No focal liver abnormality is seen. No gallstones, gallbladder wall thickening, or biliary dilatation. Pancreas: Unremarkable. No pancreatic ductal dilatation or surrounding inflammatory changes. Spleen: Normal in size without focal abnormality. Adrenals/Urinary Tract: Adrenal glands are unremarkable. Bilateral subcentimeter renal cysts. Kidneys are  otherwise without renal calculi, focal lesion, or hydronephrosis. Bladder is unremarkable. Stomach/Bowel: Stomach is within normal limits. Appendix appears normal. No evidence of bowel wall thickening, distention, or inflammatory changes. Vascular/Lymphatic: No significant vascular findings are present. No enlarged abdominal or pelvic lymph nodes. Reproductive: Low positioned right ovary with several less than 2 cm cysts. Adjacent peripheral ovarian cysts versus hydrosalpinx in the left adnexa. Normal appearance of the uterus. Other: No abdominal wall hernia or abnormality. No abdominopelvic ascites. Musculoskeletal: No acute or significant osseous findings. IMPRESSION: 1.  No evidence of acute abnormalities within the solid abdominal organs. 2. Bilateral subcentimeter renal cysts. 3. Low positioned right ovary with several less than 2 cm cysts. Adjacent peripheral ovarian cysts versus hydrosalpinx in the left adnexa. Further evaluation with pelvic ultrasound, to include Doppler evaluation, may be considered. Electronically Signed   By: Fidela Salisbury M.D.   On: 02/16/2019 18:33   [place] Art/ven Flow Abd Pelv Doppler  Result Date: 02/16/2019 CLINICAL DATA:  Left upper quadrant abdominal pain. EXAM: TRANSABDOMINAL AND TRANSVAGINAL ULTRASOUND OF PELVIS DOPPLER ULTRASOUND OF OVARIES TECHNIQUE: Both transabdominal and transvaginal ultrasound examinations of the pelvis were performed. Transabdominal technique was performed for global imaging of the pelvis including uterus, ovaries, adnexal regions, and pelvic cul-de-sac. It was necessary to proceed with endovaginal exam following the transabdominal exam to visualize the endometrium and ovaries. Color and duplex Doppler ultrasound was utilized to evaluate blood flow to the ovaries. COMPARISON:  CT scan February 16, 2019 FINDINGS: Uterus Measurements: 6.7 x 4.1 x 4.7 cm = volume: 67 mL. Contains a 2.7 x 2.3 x 2.3 cm fibroid. Endometrium Thickness: 6.5 mm.  No focal  abnormality visualized. Right ovary Measurements: 3.6 x 2.3 x 2.4 cm = volume: 9.3 mL. Contains multiple follicles with the largest measuring 1.8 cm. Left ovary Measurements: 3.2 x 2.1 x 2.9 cm = volume: 10.0 mL. Contains multiple small follicles. Pulsed Doppler evaluation of both ovaries demonstrates normal low-resistance arterial and venous waveforms. Other findings No abnormal free fluid. IMPRESSION: 1. There is a 2.7 cm fibroid in the uterus. 2. Multiple small follicles in both ovaries. The ovaries are otherwise normal. No hydrosalpinx on the left. Electronically Signed   By: Dorise Bullion III M.D   On: 02/16/2019 19:35    Procedures Procedures (including critical care time)  Medications Ordered in ED Medications  potassium chloride 10 mEq in 100 mL IVPB (10 mEq Intravenous New Bag/Given 02/18/19 1120)  sodium chloride flush (NS) 0.9 % injection 3 mL (3 mLs Intravenous Given 02/18/19 0828)  sodium chloride 0.9 % bolus 2,000 mL (0 mLs Intravenous Stopped 02/18/19 1150)  ondansetron (ZOFRAN) injection 4 mg (4 mg Intravenous Given 02/18/19 0829)  capsaicin (ZOSTRIX) 0.025 % cream ( Topical Given 02/18/19 0836)  dicyclomine (BENTYL) injection 20 mg (20 mg Intramuscular Given 02/18/19 0832)  LORazepam (ATIVAN) injection 0.5 mg (0.5 mg Intravenous Given 02/18/19 0900)  haloperidol lactate (HALDOL) injection 2 mg (2 mg Intravenous Given 02/18/19 1124)     Initial Impression / Assessment and Plan / ED Course  I have reviewed the triage vital signs and the nursing notes.  Pertinent labs & imaging results that were available during my care of the patient were reviewed by me and considered in my medical decision making (see chart for details).        Patient presenting with ongoing nausea and vomiting with left upper quadrant pain.  Suspect gastritis; unclear as cause, however suspect cannabis hyperemesis.  Patient found to be hypokalemic at 3.0, which was repleted in the ED.  Labs are otherwise unremarkable  except for 80 ketones and 30 protein in the urine.  Patient had CT and pelvic ultrasound at last visit which showed ovarian cyst, but no other acute findings.  Do not feel repeat imaging indicated today.  Patient given IV fluids, potassium, Zofran, capsaicin, IM Bentyl, and Haldol and patient is feeling much better.  She is tolerating oral fluids.  After further discussion with the patient, she has been swallowing Zofran ODT, suspect this is why she  continued vomiting.  Patient counseled on correct way to take this.  Advised to avoid the Norco, especially on an empty stomach.  Will stop Flagyl and replace with MetroGel, however patient advised she could wait until she is feeling better, as this is only to treat BV.  We will also add Carafate.  Clear liquids initially, and patient counseled on progressive diet.  Patient advised to avoid marijuana and alcohol.  Strict return precautions given.  Patient understands and agrees with plan.  Patient vitals stable throughout ED course and discharged in satisfactory condition.  Final Clinical Impressions(s) / ED Diagnoses   Final diagnoses:  Non-intractable vomiting with nausea, unspecified vomiting type  Other acute gastritis without hemorrhage    ED Discharge Orders         Ordered    metroNIDAZOLE (METROGEL VAGINAL) 0.75 % vaginal gel  Daily at bedtime     02/18/19 1544    sucralfate (CARAFATE) 1 GM/10ML suspension  3 times daily with meals & bedtime     02/18/19 [address], PA-C 02/18/19 1611    Virgel Manifold, MD 02/22/19 [phone]

## 2019-02-20 LAB — GC/CHLAMYDIA PROBE AMP (~~LOC~~) NOT AT ARMC
Chlamydia: NEGATIVE
Neisseria Gonorrhea: NEGATIVE

## 2019-04-08 ENCOUNTER — Other Ambulatory Visit: Payer: Self-pay

## 2019-04-08 ENCOUNTER — Encounter (HOSPITAL_COMMUNITY): Payer: Self-pay | Admitting: *Deleted

## 2019-04-08 ENCOUNTER — Emergency Department (HOSPITAL_COMMUNITY): Payer: BLUE CROSS/BLUE SHIELD

## 2019-04-08 ENCOUNTER — Emergency Department (HOSPITAL_COMMUNITY)
Admission: EM | Admit: 2019-04-08 | Discharge: 2019-04-08 | Disposition: A | Discharge status: Home / Self Care | Payer: BLUE CROSS/BLUE SHIELD | Attending: Emergency Medicine | Admitting: Emergency Medicine

## 2019-04-08 DIAGNOSIS — K5900 Constipation, unspecified: Secondary | ICD-10-CM | POA: Insufficient documentation

## 2019-04-08 DIAGNOSIS — R1033 Periumbilical pain: Secondary | ICD-10-CM

## 2019-04-08 DIAGNOSIS — F17210 Nicotine dependence, cigarettes, uncomplicated: Secondary | ICD-10-CM | POA: Insufficient documentation

## 2019-04-08 LAB — URINALYSIS, ROUTINE W REFLEX MICROSCOPIC
Bilirubin Urine: NEGATIVE
Glucose, UA: NEGATIVE mg/dL
Ketones, ur: 80 mg/dL — AB
Leukocytes,Ua: NEGATIVE
Nitrite: NEGATIVE
Protein, ur: 100 mg/dL — AB
Specific Gravity, Urine: 1.017 (ref 1.005–1.030)
pH: 6 (ref 5.0–8.0)

## 2019-04-08 LAB — CBC
HCT: 40.1 % (ref 36.0–46.0)
Hemoglobin: 13.7 g/dL (ref 12.0–15.0)
MCH: 34.1 pg — ABNORMAL HIGH (ref 26.0–34.0)
MCHC: 34.2 g/dL (ref 30.0–36.0)
MCV: 99.8 fL (ref 80.0–100.0)
Platelets: 215 10*3/uL (ref 150–400)
RBC: 4.02 MIL/uL (ref 3.87–5.11)
RDW: 13.2 % (ref 11.5–15.5)
WBC: 11.4 10*3/uL — ABNORMAL HIGH (ref 4.0–10.5)
nRBC: 0 % (ref 0.0–0.2)

## 2019-04-08 LAB — I-STAT CHEM 8, ED
BUN: 17 mg/dL (ref 6–20)
Calcium, Ion: 1.16 mmol/L (ref 1.15–1.40)
Chloride: 107 mmol/L (ref 98–111)
Creatinine, Ser: 0.5 mg/dL (ref 0.44–1.00)
Glucose, Bld: 161 mg/dL — ABNORMAL HIGH (ref 70–99)
HCT: 43 % (ref 36.0–46.0)
Hemoglobin: 14.6 g/dL (ref 12.0–15.0)
Potassium: 4.4 mmol/L (ref 3.5–5.1)
Sodium: 139 mmol/L (ref 135–145)
TCO2: 20 mmol/L — ABNORMAL LOW (ref 22–32)

## 2019-04-08 LAB — I-STAT BETA HCG BLOOD, ED (MC, WL, AP ONLY): I-stat hCG, quantitative: <5 m[IU]/mL (ref <5)

## 2019-04-08 MED ORDER — ALUM & MAG HYDROXIDE-SIMETH 200-200-20 MG/5ML PO SUSP
30.0000 mL | Freq: Once | ORAL | Status: AC
  Administered 2019-04-08: 10:00:00 30 mL via ORAL
  Filled 2019-04-08: qty 30

## 2019-04-08 MED ORDER — SODIUM CHLORIDE 0.9 % IV BOLUS
1000.0000 mL | Freq: Once | INTRAVENOUS | Status: AC
  Administered 2019-04-08: 1000 mL via INTRAVENOUS

## 2019-04-08 MED ORDER — HALOPERIDOL LACTATE 5 MG/ML IJ SOLN
2.0000 mg | Freq: Once | INTRAMUSCULAR | Status: AC
  Administered 2019-04-08: 2 mg via INTRAVENOUS
  Filled 2019-04-08: qty 1

## 2019-04-08 MED ORDER — DICYCLOMINE HCL 10 MG/ML IM SOLN
20.0000 mg | Freq: Once | INTRAMUSCULAR | Status: AC
  Administered 2019-04-08: 20 mg via INTRAMUSCULAR
  Filled 2019-04-08: qty 2

## 2019-04-08 MED ORDER — LIDOCAINE VISCOUS HCL 2 % MT SOLN
15.0000 mL | Freq: Once | OROMUCOSAL | Status: AC
  Administered 2019-04-08: 10:00:00 15 mL via ORAL
  Filled 2019-04-08: qty 15

## 2019-04-08 NOTE — ED Provider Notes (Signed)
Care assumed from K. Humes PA-C at shift change pending abdominal xray and cbc.  See her note for full H&P. Briefly 33 yo female presents with generalized abdominal pain and constipation x 4 days. Per previous provider pt is easily distracted during exam and there is no abdominal tenderness. Pt has received bentyl, haldol, IVF.  Plan is for symptom control, possible enema, and discharge home if appropriate.   Physical Exam  BP 131/76 (BP Location: Left Arm)   Pulse 82   Temp 98 F (36.7 C) (Oral)   Resp 17   Ht 5\' 2"  (1.575 m)   Wt 70.3 kg   LMP 03/21/2019   SpO2 98%   BMI 28.35 kg/m   Physical Exam  PE: Constitutional: well-developed, well-nourished, no apparent distress HENT: normocephalic, atraumatic. no cervical adenopathy Cardiovascular: normal rate and rhythm, distal pulses intact Pulmonary/Chest: effort normal; breath sounds clear and equal bilaterally; no wheezes or rales Abdominal: soft and non tender in all quadrants. No rigidity or guarding. No peritoneal signs. Normoactive bowel sounds. Musculoskeletal: full ROM, no edema Neurological: alert with goal directed thinking Skin: warm and dry, no rash, no diaphoresis Psychiatric: normal mood and affect, normal behavior    ED Course/Procedures   Results for orders placed or performed during the hospital encounter of 04/08/19 (from the past 24 hour(s))  I-stat chem 8, ED (not at [facility] or [facility])     Status: Abnormal   Collection Time: 04/08/19  6:58 AM  Result Value Ref Range   Sodium 139 135 - 145 mmol/L   Potassium 4.4 3.5 - 5.1 mmol/L   Chloride 107 98 - 111 mmol/L   BUN 17 6 - 20 mg/dL   Creatinine, Ser 0.50 0.44 - 1.00 mg/dL   Glucose, Bld 161 (H) 70 - 99 mg/dL   Calcium, Ion 1.16 1.15 - 1.40 mmol/L   TCO2 20 (L) 22 - 32 mmol/L   Hemoglobin 14.6 12.0 - 15.0 g/dL   HCT 43.0 36.0 - 46.0 %  I-Stat Beta hCG blood, ED ([facility], [facility], [facility] only)     Status: None   Collection Time: 04/08/19  6:58 AM  Result Value Ref Range   I-stat hCG, quantitative <5.0 <5 mIU/mL   Comment 3             MDM  Pt received in sign out. She is complaining of significant pain but on my exam is easily distracted and abdomen is non tender.Doubt acute surgical abdomen.   CMP unremarkable. beta hCG is negative. Work-up significant for leukocytosis but no obvious source of infection and patient has been afebrile with normal vitals.  Stable hemoglobin, no acute electrolyte derangements, urinalysis without signs of infection. Xray of abdomen viewed by me is negative for obstruction.  Pt feeling better after haldol and bentyl. She was initially tachycardic and writhing in pain on arrival but is now resting comfortably with improved vitals. She tolerated PO intake. Plan to discharge home with symptomatic management and recommend pcp follow up if needed.  The patient appears reasonably screened and/or stabilized for discharge and I doubt any other medical condition or other [facility] requiring further screening, evaluation, or treatment in the ED at this time prior to discharge. The patient is safe for discharge with strict return precautions discussed.   Portions of this note were generated with [employer]. Dictation errors may occur despite best attempts at proofreading.         Albrizze, Harley Hallmark, PA-C 04/08/19 1028    Belfi,  Threasa Beards, MD 04/08/19 [phone]

## 2019-04-08 NOTE — ED Triage Notes (Signed)
Pt reports that she has had constipation for about 4 days, has been taking OTC meds without relief. Generalized abdominal pain.

## 2019-04-08 NOTE — ED Provider Notes (Signed)
[facility] DEPT Provider Note   CSN: [id number] Arrival date & time: 04/08/19  0346     History   Chief Complaint Chief Complaint  Patient presents with  . Constipation    HPI Jean Reynolds is a 33 y.o. female.     33 year old female presents to the emergency department for complaints of abdominal pain.  She states that she is having diffuse abdominal pain, though it is more localized to her periumbilical region and nonradiating.  Pain is been ongoing x4 days and unrelieved with a laxative.  She states that she has not had a bowel movement since eating [employer] 4 days ago.  She does report sporadic vomiting, though no emesis was witnessed in the emergency department.  Denies fever, sick contacts, urinary symptoms, history of abdominal surgeries.  Patient continuously crying and whimpering in exam room, but not making tears.  The history is provided by the patient. No language interpreter was used.  Constipation   History reviewed. No pertinent past medical history.  There are no active problems to display for this patient.   History reviewed. No pertinent surgical history.   OB History   No obstetric history on file.      Home Medications    Prior to Admission medications   Medication Sig Start Date End Date Taking? Authorizing Provider  docusate sodium (COLACE) 100 MG capsule Take 200 mg by mouth 2 (two) times daily as needed for mild constipation.   Yes [provider]  cephALEXin (KEFLEX) 500 MG capsule Take 1 capsule (500 mg total) by mouth 4 (four) times daily. Patient not taking: Reported on 02/18/2019 12/14/13   Margarita Mail, PA-C  dicyclomine (BENTYL) 20 MG tablet Take 1 tablet (20 mg total) by mouth 2 (two) times daily. Patient not taking: Reported on 04/08/2019 02/16/19   Henderly, Britni A, PA-C  HYDROcodone-acetaminophen (NORCO/VICODIN) 5-325 MG tablet Take 1 tablet by mouth every 6 (six) hours as needed for severe  pain. Patient not taking: Reported on 04/08/2019 02/16/19   Henderly, Britni A, PA-C  ibuprofen (ADVIL,MOTRIN) 800 MG tablet Take 1 tablet (800 mg total) by mouth 3 (three) times daily. Patient not taking: Reported on 02/18/2019 10/29/13   Ignacia Felling, PA-C  ondansetron (ZOFRAN ODT) 4 MG disintegrating tablet Take 1 tablet (4 mg total) by mouth every 8 (eight) hours as needed for nausea or vomiting. Patient not taking: Reported on 04/08/2019 02/16/19   Henderly, Britni A, PA-C  ondansetron (ZOFRAN) 4 MG tablet Take 1 tablet (4 mg total) every 6 (six) hours by mouth. Patient not taking: Reported on 02/18/2019 05/30/17   Janne Napoleon, NP  sucralfate (CARAFATE) 1 GM/10ML suspension Take 10 mLs (1 g total) by mouth 4 (four) times daily -  with meals and at bedtime. Patient not taking: Reported on 04/08/2019 02/18/19   Frederica Kuster, PA-C    Family History No family history on file.  Social History Social History   Tobacco Use  . Smoking status: Current Every Day Smoker    Packs/day: 0.30    Types: Cigarettes  . Smokeless tobacco: Never Used  Substance Use Topics  . Alcohol use: Yes  . Drug use: Yes    Types: Marijuana     Allergies   Patient has no known allergies.   Review of Systems Review of Systems  Gastrointestinal: Positive for constipation.  Ten systems reviewed and are negative for acute change, except as noted in the HPI.    Physical Exam  Updated Vital Signs BP 131/76 (BP Location: Left Arm)   Pulse 82   Temp 98 F (36.7 C) (Oral)   Resp 17   Ht 5\' 2"  (1.575 m)   Wt 70.3 kg   LMP 03/21/2019   SpO2 98%   BMI 28.35 kg/m   Physical Exam Vitals signs and nursing note reviewed.  Constitutional:      General: She is not in acute distress.    Appearance: She is well-developed. She is not diaphoretic.     Comments: Wailing and sobbing on the exam room bed, but not making tears. Inconsolable and slightly difficult to redirect.  HENT:     Head: Normocephalic and  atraumatic.  Eyes:     General: No scleral icterus.    Conjunctiva/sclera: Conjunctivae normal.  Neck:     Musculoskeletal: Normal range of motion.  Cardiovascular:     Rate and Rhythm: Regular rhythm. Tachycardia present.     Pulses: Normal pulses.  Pulmonary:     Effort: Pulmonary effort is normal. No respiratory distress.     Comments: Respirations even and unlabored Abdominal:     Comments: Despite patient reporting an immense amount of pain, she has no tenderness to palpation.  No palpable masses, abdominal distention.  Abdomen is soft without peritoneal signs.  Musculoskeletal: Normal range of motion.  Skin:    General: Skin is warm and dry.     Coloration: Skin is not pale.     Findings: No erythema or rash.  Neurological:     Mental Status: She is alert and oriented to person, place, and time.     Coordination: Coordination normal.     Comments: Ambulatory in the ED with steady gait.  Psychiatric:        Mood and Affect: Affect is inappropriate.        Speech: Speech normal.        Behavior: Behavior is agitated.      ED Treatments / Results  Labs (all labs ordered are listed, but only abnormal results are displayed) Labs Reviewed  CBC  I-STAT CHEM 8, ED  I-STAT BETA HCG BLOOD, ED ([facility], [facility], [facility] ONLY)    EKG None  Radiology No results found.  Procedures Procedures (including critical care time)  Medications Ordered in ED Medications  dicyclomine (BENTYL) injection 20 mg (has no administration in time range)  haloperidol lactate (HALDOL) injection 2 mg (has no administration in time range)  sodium chloride 0.9 % bolus 1,000 mL (has no administration in time range)      Initial Impression / Assessment and Plan / ED Course  I have reviewed the triage vital signs and the nursing notes.  Pertinent labs & imaging results that were available during my care of the patient were reviewed by me and considered in my medical decision making (see chart for  details).        6:00 AM Went to assess patient, but not in room. Seen wandering up towards triage.  6:34 AM Assessed after return to room. Very dramatic and inconsolable, slightly difficult to redirect.   She reports 4 days of abdominal pain which is periumbilical, constant, unrelieved with laxatives.  She has been constipated as well with sporadic vomiting.  X-ray ordered to assess for constipation versus obstruction versus free air.  Her exam today is fairly benign despite her outward appearance.  Will obtain basic labs and UA.  Haldol and Bentyl ordered for symptom control in addition to IVF.  7:05 AM Patient  signed out to Albrizze, PA-C at shift change pending labs and Xray.   Final Clinical Impressions(s) / ED Diagnoses   Final diagnoses:  Constipation  Periumbilical abdominal pain    ED Discharge Orders    None       Antonietta Breach, PA-C 04/08/19 [id number]    Palumbo, April, MD 04/08/19 [phone]

## 2019-04-08 NOTE — ED Notes (Addendum)
Patient had 5 episodes of emesis prior to medication administration. New emesis bag provided. Patient made aware that urine sample is needed. Will continue to monitor patient.

## 2019-04-08 NOTE — Discharge Instructions (Signed)
To help reduce constipation and promote bowel health, 1. Drink at least 64 ounces of water each day; 2. Eat plenty of fiber (fruits, vegetables, whole grains, legumes) 3. Get plenty of physical activity  If needed, you may also use daily or as needed, MiraLax (Osmotic Laxative) up to  1-2 times a day and Colace (Stool Softener aka Docusate) 100 mg up to twice a day to help with bowel movements. These medications are over the counter.  MiraLax is an Osmotic You may use other over-the-counter medications such as Dulcolax, Fleet enemas, magnesium citrate as needed for constipation. Please note that some of these medications may cause you to have abdominal cramping which is normal. If you develop severe abdominal pain, fever, vomiting, distention of your abdomen, unable to have a bowel movement for 5 days or are not passing gas, please return to the hospital.  Return to the Emergency Department for any fever, worsening pain, blood in stool, severe abdominal pain, or any other worsening or concerning symptoms.  

## 2019-04-08 NOTE — ED Notes (Signed)
Patient ambulatory to x-ray.

## 2019-04-08 NOTE — ED Notes (Signed)
RN will collect labs at IV start 

## 2019-05-13 ENCOUNTER — Emergency Department (HOSPITAL_COMMUNITY)
Admission: EM | Admit: 2019-05-13 | Discharge: 2019-05-13 | Disposition: A | Discharge status: Home / Self Care | Payer: BC Managed Care – PPO | Attending: Emergency Medicine | Admitting: Emergency Medicine

## 2019-05-13 ENCOUNTER — Encounter (HOSPITAL_COMMUNITY): Payer: Self-pay | Admitting: Emergency Medicine

## 2019-05-13 ENCOUNTER — Other Ambulatory Visit: Payer: Self-pay

## 2019-05-13 DIAGNOSIS — F17210 Nicotine dependence, cigarettes, uncomplicated: Secondary | ICD-10-CM | POA: Insufficient documentation

## 2019-05-13 DIAGNOSIS — E876 Hypokalemia: Secondary | ICD-10-CM | POA: Insufficient documentation

## 2019-05-13 DIAGNOSIS — R1084 Generalized abdominal pain: Secondary | ICD-10-CM | POA: Diagnosis not present

## 2019-05-13 DIAGNOSIS — R112 Nausea with vomiting, unspecified: Secondary | ICD-10-CM | POA: Insufficient documentation

## 2019-05-13 HISTORY — DX: Irritable bowel syndrome, unspecified: K58.9

## 2019-05-13 LAB — URINALYSIS, ROUTINE W REFLEX MICROSCOPIC
Bacteria, UA: NONE SEEN
Bilirubin Urine: NEGATIVE
Glucose, UA: NEGATIVE mg/dL
Ketones, ur: 80 mg/dL — AB
Leukocytes,Ua: NEGATIVE
Nitrite: NEGATIVE
Protein, ur: 100 mg/dL — AB
Specific Gravity, Urine: 1.031 — ABNORMAL HIGH (ref 1.005–1.030)
pH: 7 (ref 5.0–8.0)

## 2019-05-13 LAB — CBC
HCT: 34.9 % — ABNORMAL LOW (ref 36.0–46.0)
Hemoglobin: 11.8 g/dL — ABNORMAL LOW (ref 12.0–15.0)
MCH: 33.7 pg (ref 26.0–34.0)
MCHC: 33.8 g/dL (ref 30.0–36.0)
MCV: 99.7 fL (ref 80.0–100.0)
Platelets: 290 10*3/uL (ref 150–400)
RBC: 3.5 MIL/uL — ABNORMAL LOW (ref 3.87–5.11)
RDW: 13 % (ref 11.5–15.5)
WBC: 11 10*3/uL — ABNORMAL HIGH (ref 4.0–10.5)
nRBC: 0 % (ref 0.0–0.2)

## 2019-05-13 LAB — COMPREHENSIVE METABOLIC PANEL
ALT: 24 U/L (ref 0–44)
AST: 23 U/L (ref 15–41)
Albumin: 4.3 g/dL (ref 3.5–5.0)
Alkaline Phosphatase: 100 U/L (ref 38–126)
Anion gap: 15 (ref 5–15)
BUN: 17 mg/dL (ref 6–20)
CO2: 20 mmol/L — ABNORMAL LOW (ref 22–32)
Calcium: 9.9 mg/dL (ref 8.9–10.3)
Chloride: 105 mmol/L (ref 98–111)
Creatinine, Ser: 0.78 mg/dL (ref 0.44–1.00)
GFR calc Af Amer: >60 mL/min (ref >60)
GFR calc non Af Amer: >60 mL/min (ref >60)
Glucose, Bld: 124 mg/dL — ABNORMAL HIGH (ref 70–99)
Potassium: 3.3 mmol/L — ABNORMAL LOW (ref 3.5–5.1)
Sodium: 140 mmol/L (ref 135–145)
Total Bilirubin: 0.6 mg/dL (ref 0.3–1.2)
Total Protein: 7.7 g/dL (ref 6.5–8.1)

## 2019-05-13 LAB — LIPASE, BLOOD: Lipase: 26 U/L (ref 11–51)

## 2019-05-13 LAB — I-STAT BETA HCG BLOOD, ED (MC, WL, AP ONLY): I-stat hCG, quantitative: <5 m[IU]/mL (ref <5)

## 2019-05-13 MED ORDER — SODIUM CHLORIDE 0.9% FLUSH: 3.0000 mL | Freq: Once | INTRAVENOUS | Status: DC

## 2019-05-13 MED ORDER — DICYCLOMINE HCL 10 MG/ML IM SOLN
20.0000 mg | Freq: Once | INTRAMUSCULAR | Status: AC
  Administered 2019-05-13: 18:00:00 20 mg via INTRAMUSCULAR
  Filled 2019-05-13: qty 2

## 2019-05-13 MED ORDER — ONDANSETRON HCL 4 MG/2ML IJ SOLN
4.0000 mg | Freq: Once | INTRAMUSCULAR | Status: AC
  Administered 2019-05-13: 18:00:00 4 mg via INTRAVENOUS
  Filled 2019-05-13: qty 2

## 2019-05-13 MED ORDER — HALOPERIDOL LACTATE 5 MG/ML IJ SOLN
5.0000 mg | Freq: Once | INTRAMUSCULAR | Status: AC
  Administered 2019-05-13: 19:00:00 5 mg via INTRAVENOUS
  Filled 2019-05-13: qty 1

## 2019-05-13 MED ORDER — DICYCLOMINE HCL 20 MG PO TABS: 20.0000 mg | ORAL_TABLET | Freq: Two times a day (BID) | ORAL | 0 refills | Status: DC

## 2019-05-13 MED ORDER — SODIUM CHLORIDE 0.9 % IV BOLUS
1000.0000 mL | Freq: Once | INTRAVENOUS | Status: AC
  Administered 2019-05-13: 18:00:00 1000 mL via INTRAVENOUS

## 2019-05-13 MED ORDER — ONDANSETRON HCL 4 MG PO TABS: 4.0000 mg | ORAL_TABLET | Freq: Four times a day (QID) | ORAL | 0 refills | Status: DC

## 2019-05-13 MED ORDER — POTASSIUM CHLORIDE CRYS ER 20 MEQ PO TBCR
40.0000 meq | EXTENDED_RELEASE_TABLET | Freq: Once | ORAL | Status: AC
  Administered 2019-05-13: 20:00:00 40 meq via ORAL
  Filled 2019-05-13: qty 2

## 2019-05-13 NOTE — ED Triage Notes (Signed)
C/o nausea, vomiting, and generalized abd spasms since yesterday.  Denies diarrhea.  Hx of IBS.

## 2019-05-13 NOTE — ED Provider Notes (Signed)
[facility] EMERGENCY DEPARTMENT Provider Note   CSN: [id number] Arrival date & time: 05/13/19  1548     History   Chief Complaint Chief Complaint  Patient presents with  . Abdominal Pain    HPI Jean Reynolds is a 33 y.o. female.     33 year old female with history of IBS presents with complaint of abdominal spasms with nausea and vomiting.  Patient reports constipation, last bowel movement was yesterday, states that she takes MiraLAX daily to produce bowel movements however due to her vomiting has been unable to take her daily MiraLAX.  Patient reports similar episodes previously, denies any new or new worsening symptoms today.  Emesis is nonbloody.  Denies fevers, chills, sick contacts, changes in bladder habits.  No other complaints or concerns.  LMP now.     Past Medical History:  Diagnosis Date  . IBS (irritable bowel syndrome)     There are no active problems to display for this patient.   History reviewed. No pertinent surgical history.   OB History   No obstetric history on file.      Home Medications    Prior to Admission medications   Medication Sig Start Date End Date Taking? Authorizing Provider  dicyclomine (BENTYL) 20 MG tablet Take 1 tablet (20 mg total) by mouth 2 (two) times daily. 05/13/19   Tacy Learn, PA-C  docusate sodium (COLACE) 100 MG capsule Take 200 mg by mouth 2 (two) times daily as needed for mild constipation.    [provider]  ondansetron (ZOFRAN ODT) 4 MG disintegrating tablet Take 1 tablet (4 mg total) by mouth every 8 (eight) hours as needed for nausea or vomiting. Patient not taking: Reported on 04/08/2019 02/16/19   Henderly, Britni A, PA-C  ondansetron (ZOFRAN) 4 MG tablet Take 1 tablet (4 mg total) by mouth every 6 (six) hours. 05/13/19   Tacy Learn, PA-C  sucralfate (CARAFATE) 1 GM/10ML suspension Take 10 mLs (1 g total) by mouth 4 (four) times daily -  with meals and at bedtime. Patient not  taking: Reported on 04/08/2019 02/18/19 05/13/19  Frederica Kuster, PA-C    Family History No family history on file.  Social History Social History   Tobacco Use  . Smoking status: Current Every Day Smoker    Packs/day: 0.30    Types: Cigarettes  . Smokeless tobacco: Never Used  Substance Use Topics  . Alcohol use: Yes  . Drug use: Yes    Types: Marijuana     Allergies   Patient has no known allergies.   Review of Systems Review of Systems  Constitutional: Negative for chills, diaphoresis and fever.  Respiratory: Negative for shortness of breath.   Cardiovascular: Negative for chest pain.  Gastrointestinal: Positive for abdominal pain, constipation, nausea and vomiting. Negative for blood in stool and diarrhea.  Genitourinary: Negative for dysuria, frequency, urgency and vaginal discharge.  Musculoskeletal: Negative for arthralgias and myalgias.  Skin: Negative for rash and wound.  Allergic/Immunologic: Negative for immunocompromised state.  Neurological: Negative for dizziness and weakness.  Psychiatric/Behavioral: Negative for confusion.  All other systems reviewed and are negative.    Physical Exam Updated Vital Signs BP 130/83 (BP Location: Right Arm)   Pulse 78   Temp (!) 97.4 F (36.3 C) (Oral)   Resp 16   LMP 05/12/2019   SpO2 100%   Physical Exam Vitals signs and nursing note reviewed.  Constitutional:      General: She is not in  acute distress.    Appearance: She is well-developed. She is not diaphoretic.  HENT:     Head: Normocephalic and atraumatic.  Cardiovascular:     Rate and Rhythm: Regular rhythm. Tachycardia present.     Heart sounds: Normal heart sounds.  Pulmonary:     Effort: Pulmonary effort is normal.     Breath sounds: Normal breath sounds.  Abdominal:     General: Abdomen is flat.     Palpations: Abdomen is soft.     Tenderness: There is no abdominal tenderness.  Skin:    General: Skin is warm and dry.     Findings: No rash.   Neurological:     Mental Status: She is alert and oriented to person, place, and time.  Psychiatric:        Behavior: Behavior normal.      ED Treatments / Results  Labs (all labs ordered are listed, but only abnormal results are displayed) Labs Reviewed  COMPREHENSIVE METABOLIC PANEL - Abnormal; Notable for the following components:      Result Value   Potassium 3.3 (*)    CO2 20 (*)    Glucose, Bld 124 (*)    All other components within normal limits  CBC - Abnormal; Notable for the following components:   WBC 11.0 (*)    RBC 3.50 (*)    Hemoglobin 11.8 (*)    HCT 34.9 (*)    All other components within normal limits  URINALYSIS, ROUTINE W REFLEX MICROSCOPIC - Abnormal; Notable for the following components:   Color, Urine AMBER (*)    APPearance HAZY (*)    Specific Gravity, Urine 1.031 (*)    Hgb urine dipstick LARGE (*)    Ketones, ur 80 (*)    Protein, ur 100 (*)    All other components within normal limits  LIPASE, BLOOD  I-STAT BETA HCG BLOOD, ED ([facility], [facility], [facility] ONLY)    EKG None  Radiology No results found.  Procedures Procedures (including critical care time)  Medications Ordered in ED Medications  sodium chloride flush (NS) 0.9 % injection 3 mL (has no administration in time range)  potassium chloride SA (KLOR-CON) CR tablet 40 mEq (has no administration in time range)  sodium chloride 0.9 % bolus 1,000 mL (1,000 mLs Intravenous New Bag/Given 05/13/19 1731)  ondansetron (ZOFRAN) injection 4 mg (4 mg Intravenous Given 05/13/19 1731)  dicyclomine (BENTYL) injection 20 mg (20 mg Intramuscular Given 05/13/19 1759)  haloperidol lactate (HALDOL) injection 5 mg (5 mg Intravenous Given 05/13/19 1845)     Initial Impression / Assessment and Plan / ED Course  I have reviewed the triage vital signs and the nursing notes.  Pertinent labs & imaging results that were available during my care of the patient were reviewed by me and considered in my medical decision  making (see chart for details).  Clinical Course as of May 13 1855  Nancy Fetter May 12, 1229  3864 33 year old female presents with complaint of abdominal cramping and vomiting, reports history of IBS and states feels similar to prior episodes.  On exam patient is well-appearing, abdomen soft and nontender, emesis bag with small amount of nonbloody emesis. Patient was given IV fluids and Zofran, reported ongoing abdominal cramping requesting medication she is getting her last visit.  Patient is given Bentyl without improvement in her pain, then given Haldol. Review of lab work, urinalysis with large amount hemoglobin, ketones and protein, patient is on her menstrual cycle.  hCG is negative.  CMP with mild hypokalemia with potassium of 3.3 which is replaced orally.  CBC with mild leukocytosis and hemoglobin of 11.8.   [LM]  1856 Pain has completely resolved, patient is ready for discharge.  Refills for Bentyl and Zofran sent to patient's pharmacy with plan for patient to follow-up with her GI.   [LM]    Clinical Course User Index [LM] Tacy Learn, PA-C      Final Clinical Impressions(s) / ED Diagnoses   Final diagnoses:  Non-intractable vomiting with nausea, unspecified vomiting type  Generalized abdominal pain  Hypokalemia    ED Discharge Orders         Ordered    dicyclomine (BENTYL) 20 MG tablet  2 times daily     05/13/19 1851    ondansetron (ZOFRAN) 4 MG tablet  Every 6 hours     05/13/19 1851           Tacy Learn, PA-C 05/13/19 1856    Wyvonnia Dusky, MD 05/14/19 1144

## 2019-05-13 NOTE — Discharge Instructions (Addendum)
Follow-up with your gastroenterologist.  Prescription sent to your pharmacy for Bentyl and Zofran to take as prescribed.

## 2020-07-15 ENCOUNTER — Ambulatory Visit (HOSPITAL_COMMUNITY)
Admission: EM | Admit: 2020-07-15 | Discharge: 2020-07-15 | Disposition: A | Discharge status: Home / Self Care | Payer: Self-pay | Attending: Family Medicine | Admitting: Family Medicine

## 2020-07-15 ENCOUNTER — Other Ambulatory Visit: Payer: Self-pay

## 2020-07-15 ENCOUNTER — Encounter (HOSPITAL_COMMUNITY): Payer: Self-pay

## 2020-07-15 DIAGNOSIS — Z283 Underimmunization status: Secondary | ICD-10-CM | POA: Insufficient documentation

## 2020-07-15 DIAGNOSIS — R059 Cough, unspecified: Secondary | ICD-10-CM

## 2020-07-15 DIAGNOSIS — R509 Fever, unspecified: Secondary | ICD-10-CM

## 2020-07-15 DIAGNOSIS — R6883 Chills (without fever): Secondary | ICD-10-CM

## 2020-07-15 DIAGNOSIS — J069 Acute upper respiratory infection, unspecified: Secondary | ICD-10-CM

## 2020-07-15 DIAGNOSIS — Z720 Tobacco use: Secondary | ICD-10-CM | POA: Insufficient documentation

## 2020-07-15 DIAGNOSIS — J029 Acute pharyngitis, unspecified: Secondary | ICD-10-CM

## 2020-07-15 DIAGNOSIS — U071 COVID-19: Secondary | ICD-10-CM | POA: Insufficient documentation

## 2020-07-15 LAB — POCT RAPID STREP A, ED / UC: Streptococcus, Group A Screen (Direct): NEGATIVE

## 2020-07-15 MED ORDER — PSEUDOEPHEDRINE HCL 60 MG PO TABS: 60.0000 mg | ORAL_TABLET | Freq: Three times a day (TID) | ORAL | 0 refills | Status: AC | PRN

## 2020-07-15 MED ORDER — BENZONATATE 100 MG PO CAPS: 100.0000 mg | ORAL_CAPSULE | Freq: Three times a day (TID) | ORAL | 0 refills | Status: AC | PRN

## 2020-07-15 MED ORDER — CETIRIZINE HCL 10 MG PO TABS: 10.0000 mg | ORAL_TABLET | Freq: Every day | ORAL | 0 refills | Status: AC

## 2020-07-15 MED ORDER — PROMETHAZINE-DM 6.25-15 MG/5ML PO SYRP: 5.0000 mL | ORAL_SOLUTION | Freq: Every evening | ORAL | 0 refills | Status: AC | PRN

## 2020-07-15 NOTE — ED Triage Notes (Signed)
Pt reports cough, chills, nasal congestion, fever (101.0 F) body aches and sore throat x 32 hrs.

## 2020-07-15 NOTE — ED Provider Notes (Signed)
[facility]   MRN: [id number] DOB: 1986-06-06  Subjective:   Jean Reynolds is a 35 y.o. female presenting for 2 to 3-day history of acute onset fever, body aches, cough, chills, sinus congestion.  Patient had multiple sick contacts over the weekend.  She is averse to be evaluated.  Denies having had flu or Covid vaccination.  Denies chest pain, shortness of breath, loss sense of taste and smell.  Smokes on occasion.  Denies taking chronic medications.  No Known Allergies  Past Medical History:  Diagnosis Date  . IBS (irritable bowel syndrome)      History reviewed. No pertinent surgical history.  History reviewed. No pertinent family history.  Social History   Tobacco Use  . Smoking status: Current Every Day Smoker    Packs/day: 0.30    Types: Cigarettes  . Smokeless tobacco: Never Used  Substance Use Topics  . Alcohol use: Yes  . Drug use: Yes    Types: Marijuana    ROS   Objective:   Vitals: BP 118/61 (BP Location: Left Arm)   Pulse 68   Temp 98.4 F (36.9 C) (Oral)   Resp 18   LMP  (Within Weeks) Comment: 1 week  SpO2 98%   Physical Exam Constitutional:      General: She is not in acute distress.    Appearance: Normal appearance. She is well-developed. She is not ill-appearing, toxic-appearing or diaphoretic.  HENT:     Head: Normocephalic and atraumatic.     Nose: Nose normal.     Mouth/Throat:     Mouth: Mucous membranes are moist.     Pharynx: Oropharynx is clear. No oropharyngeal exudate or posterior oropharyngeal erythema.  Eyes:     General: No scleral icterus.       Right eye: No discharge.        Left eye: No discharge.     Extraocular Movements: Extraocular movements intact.     Pupils: Pupils are equal, round, and reactive to light.  Cardiovascular:     Rate and Rhythm: Normal rate and regular rhythm.     Pulses: Normal pulses.     Heart sounds: Normal heart sounds. No murmur heard. No friction rub. No gallop.    Pulmonary:     Effort: Pulmonary effort is normal. No respiratory distress.     Breath sounds: Normal breath sounds. No stridor. No wheezing, rhonchi or rales.  Skin:    General: Skin is warm and dry.     Findings: No rash.  Neurological:     Mental Status: She is alert and oriented to person, place, and time.  Psychiatric:        Mood and Affect: Mood normal.        Behavior: Behavior normal.        Thought Content: Thought content normal.        Judgment: Judgment normal.     Results for orders placed or performed during the hospital encounter of 07/15/20 (from the past 24 hour(s))  POCT Rapid Strep A     Status: None   Collection Time: 07/15/20 11:58 AM  Result Value Ref Range   Streptococcus, Group A Screen (Direct) NEGATIVE NEGATIVE    Assessment and Plan :   PDMP not reviewed this encounter.  1. Viral URI with cough   2. Chills     Will manage for viral illness such as viral URI, viral syndrome, viral rhinitis, COVID-19. Counseled patient on nature of COVID-19  including modes of transmission, diagnostic testing, management and supportive care.  Offered scripts for symptomatic relief. Strep culture, COVID 19 testing is pending. Counseled patient on potential for adverse effects with medications prescribed/recommended today, ER and return-to-clinic precautions discussed, patient verbalized understanding.     Wallis Bamberg, PA-C 07/15/20 1236

## 2020-07-15 NOTE — Discharge Instructions (Signed)

## 2020-07-16 LAB — SARS CORONAVIRUS 2 (TAT 6-24 HRS): SARS Coronavirus 2: POSITIVE — AB

## 2020-07-18 LAB — CULTURE, GROUP A STREP (THRC)

## 2021-02-21 IMAGING — CT CT ABDOMEN AND PELVIS WITH CONTRAST
2 of 4 series · 16 of 46 positions shown, 18 images · IV contrast (omnipaque)
Comparison: None.

CLINICAL DATA: Abdominal pain for 2 days.

EXAM:
CT ABDOMEN AND PELVIS WITH CONTRAST
TECHNIQUE: Multidetector CT imaging of the abdomen and pelvis was performed
using the standard protocol following bolus administration of
intravenous contrast.
CONTRAST:  100mL OMNIPAQUE IOHEXOL 300 MG/ML  SOLN

[Series 3: a/p w/ 5mm · axial · 0.78mm/px · z∈[-218,+207]mm · 13 of 93 slices shown, 15 images]
[im 4/93  soft-tissue]
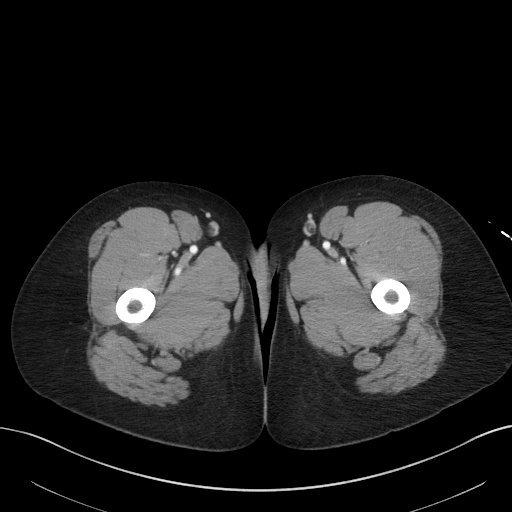
[im 4/93  bone]
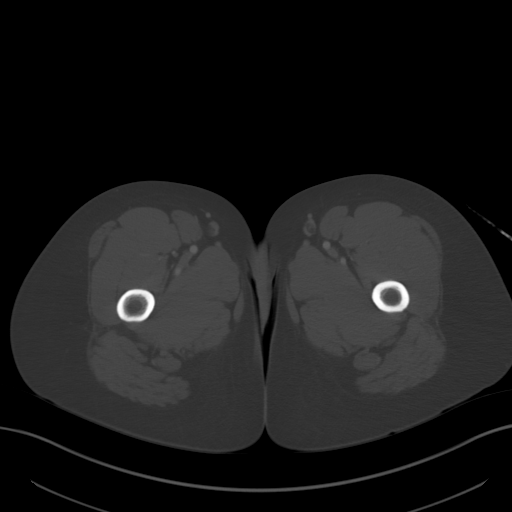
[im 12/93  soft-tissue]
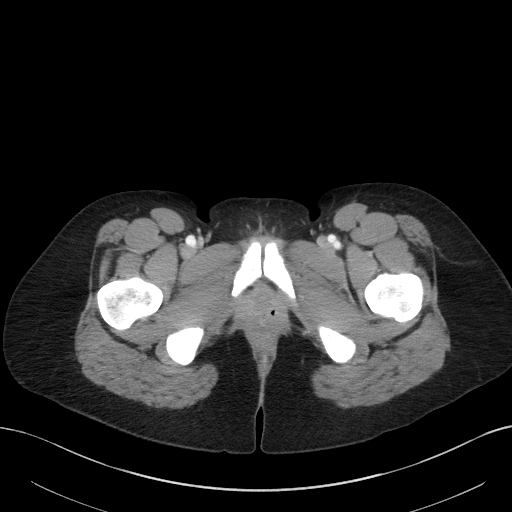
[im 19/93  soft-tissue]
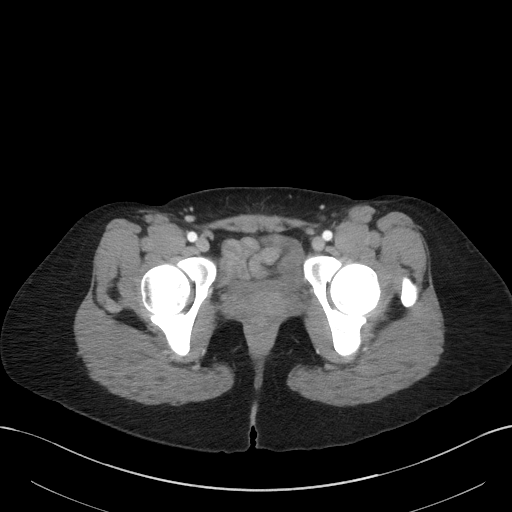
[im 26/93  soft-tissue]
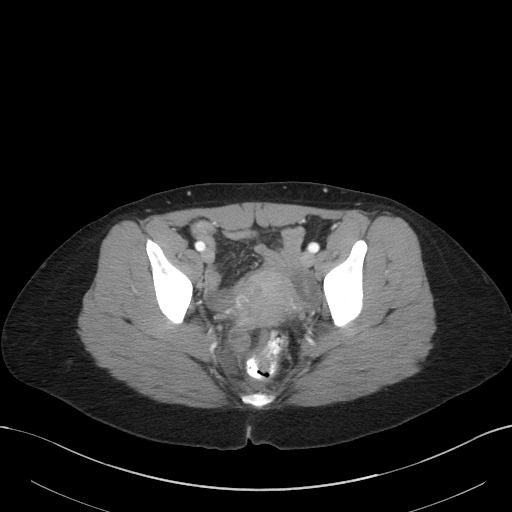
[im 34/93  soft-tissue]
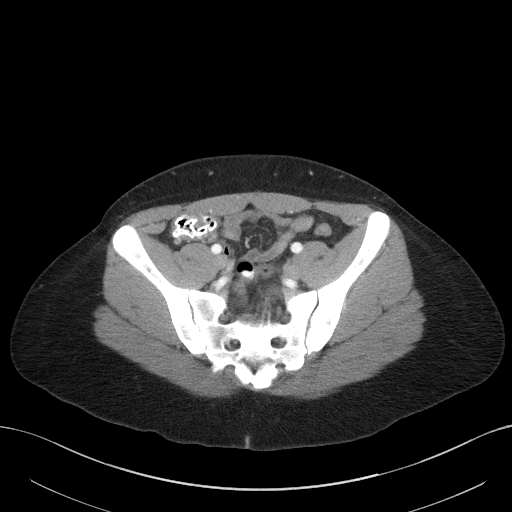
[im 41/93  soft-tissue]
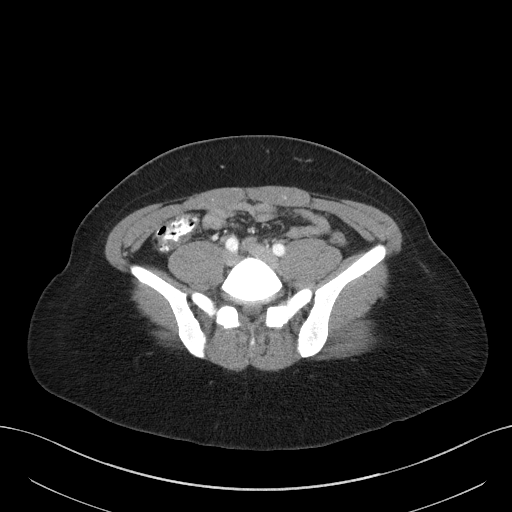
[im 48/93  soft-tissue]
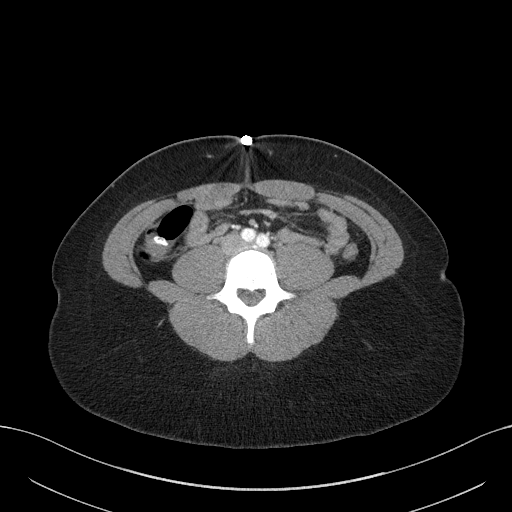
[im 52/93  soft-tissue]
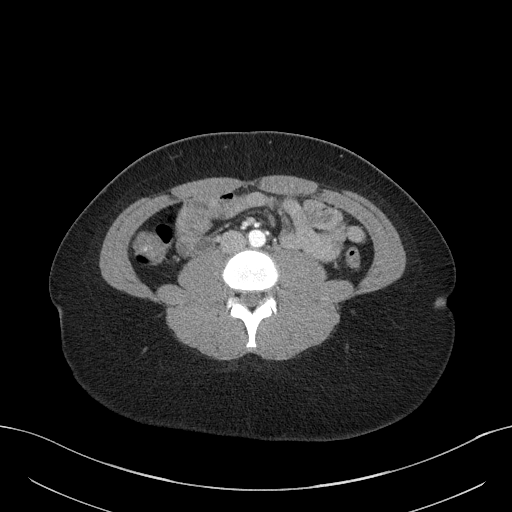
[im 59/93  soft-tissue]
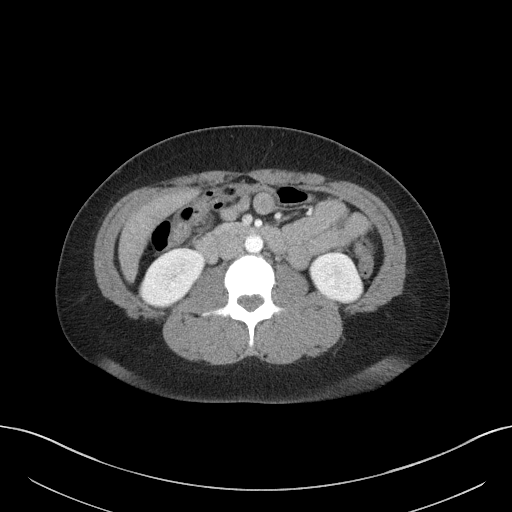
[im 59/93  bone]
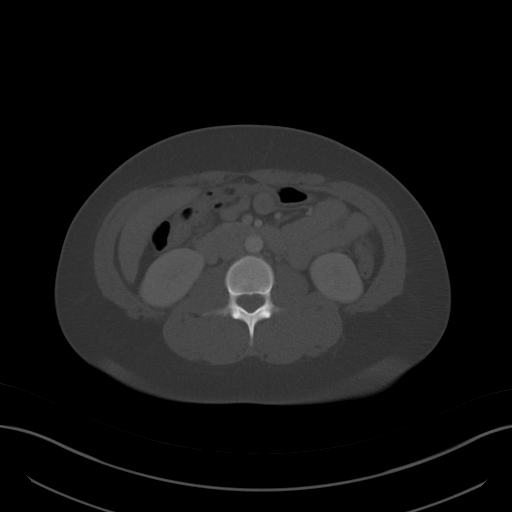
[im 67/93  soft-tissue]
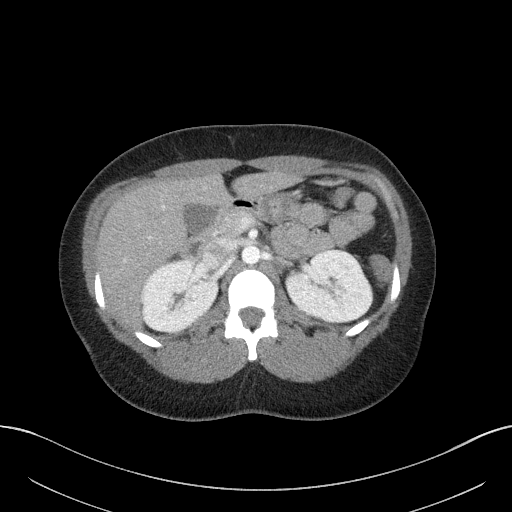
[im 74/93  soft-tissue]
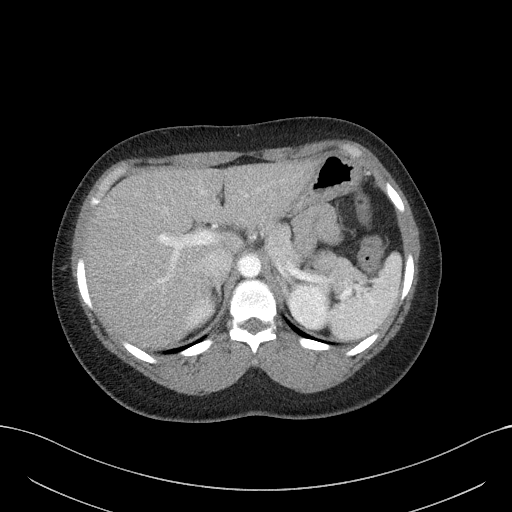
[im 81/93  soft-tissue]
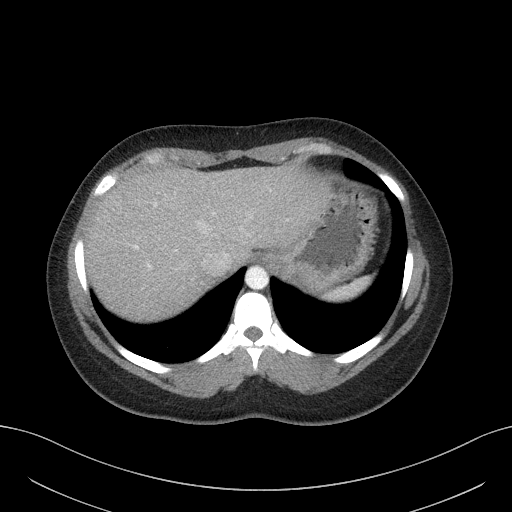
[im 89/93  soft-tissue]
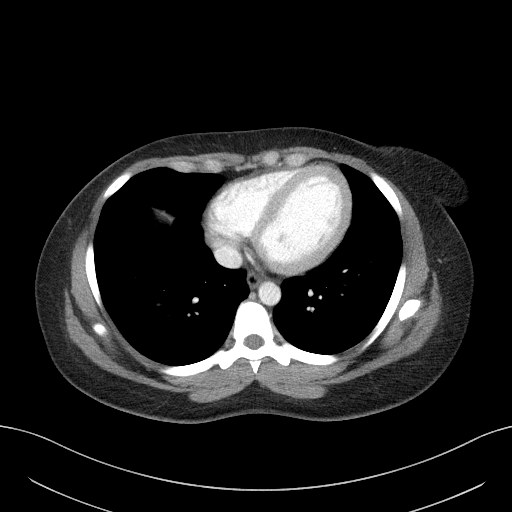

[Series 6: a/p w/ cor · coronal · 0.73mm/px · 3 of 151 slices shown]
[im 51/151  soft-tissue]
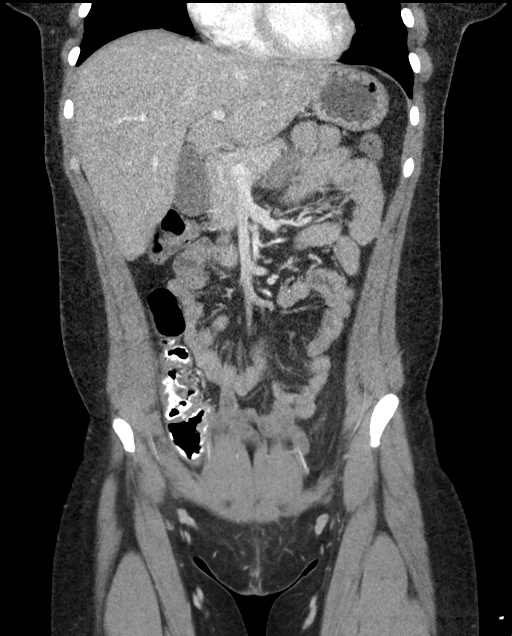
[im 67/151  soft-tissue]
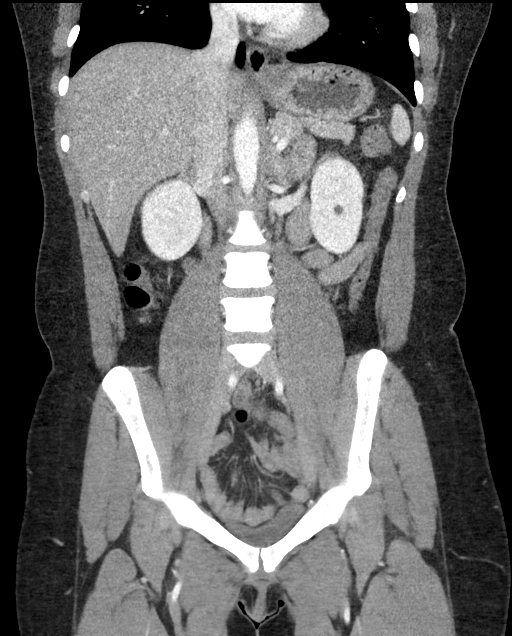
[im 84/151  soft-tissue]
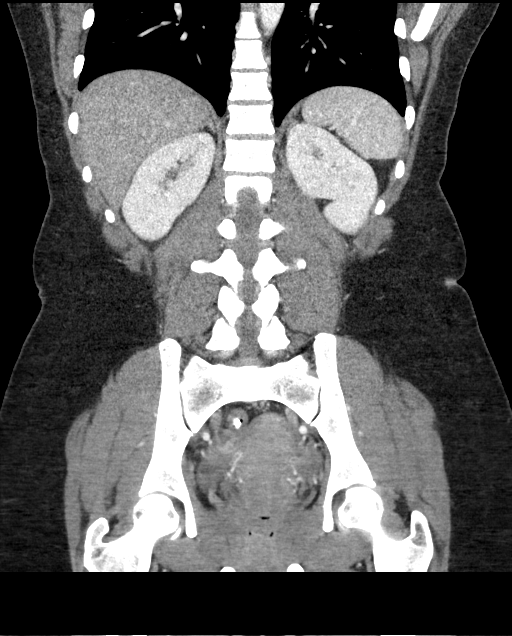

[16 of 46 positions shown; findings below may reference images not displayed]

FINDINGS: Lower chest: No acute abnormality.

Hepatobiliary: No focal liver abnormality is seen. No gallstones,
gallbladder wall thickening, or biliary dilatation.

Pancreas: Unremarkable. No pancreatic ductal dilatation or
surrounding inflammatory changes.

Spleen: Normal in size without focal abnormality.

Adrenals/Urinary Tract: Adrenal glands are unremarkable. Bilateral
subcentimeter renal cysts. Kidneys are otherwise without renal
calculi, focal lesion, or hydronephrosis. Bladder is unremarkable.

Stomach/Bowel: Stomach is within normal limits. Appendix appears
normal. No evidence of bowel wall thickening, distention, or
inflammatory changes.

Vascular/Lymphatic: No significant vascular findings are present. No
enlarged abdominal or pelvic lymph nodes.

Reproductive: Low positioned right ovary with several less than 2 cm
cysts. Adjacent peripheral ovarian cysts versus hydrosalpinx in the
left adnexa. Normal appearance of the uterus.

Other: No abdominal wall hernia or abnormality. No abdominopelvic
ascites.

Musculoskeletal: No acute or significant osseous findings.
IMPRESSION: 1. No evidence of acute abnormalities within the solid abdominal
organs.
2. Bilateral subcentimeter renal cysts.
3. Low positioned right ovary with several less than 2 cm cysts.
Adjacent peripheral ovarian cysts versus hydrosalpinx in the left
adnexa. Further evaluation with pelvic ultrasound, to include
Doppler evaluation, may be considered.

## 2021-04-12 ENCOUNTER — Other Ambulatory Visit: Payer: Self-pay

## 2021-04-12 ENCOUNTER — Emergency Department (HOSPITAL_COMMUNITY)
Admission: EM | Admit: 2021-04-12 | Discharge: 2021-04-12 | Disposition: A | Discharge status: Home / Self Care | Payer: Self-pay | Attending: Emergency Medicine | Admitting: Emergency Medicine

## 2021-04-12 DIAGNOSIS — R1084 Generalized abdominal pain: Secondary | ICD-10-CM | POA: Insufficient documentation

## 2021-04-12 DIAGNOSIS — Z5321 Procedure and treatment not carried out due to patient leaving prior to being seen by health care provider: Secondary | ICD-10-CM | POA: Insufficient documentation

## 2021-04-12 DIAGNOSIS — R1110 Vomiting, unspecified: Secondary | ICD-10-CM | POA: Insufficient documentation

## 2021-04-12 LAB — COMPREHENSIVE METABOLIC PANEL
ALT: 15 U/L (ref 0–44)
AST: 21 U/L (ref 15–41)
Albumin: 4.3 g/dL (ref 3.5–5.0)
Alkaline Phosphatase: 91 U/L (ref 38–126)
Anion gap: 7 (ref 5–15)
BUN: 15 mg/dL (ref 6–20)
CO2: 22 mmol/L (ref 22–32)
Calcium: 9.6 mg/dL (ref 8.9–10.3)
Chloride: 109 mmol/L (ref 98–111)
Creatinine, Ser: 0.66 mg/dL (ref 0.44–1.00)
GFR, Estimated: >60 mL/min (ref >60)
Glucose, Bld: 130 mg/dL — ABNORMAL HIGH (ref 70–99)
Potassium: 3.9 mmol/L (ref 3.5–5.1)
Sodium: 138 mmol/L (ref 135–145)
Total Bilirubin: 0.7 mg/dL (ref 0.3–1.2)
Total Protein: 7.8 g/dL (ref 6.5–8.1)

## 2021-04-12 LAB — CBC
HCT: 37 % (ref 36.0–46.0)
Hemoglobin: 12.3 g/dL (ref 12.0–15.0)
MCH: 33.2 pg (ref 26.0–34.0)
MCHC: 33.2 g/dL (ref 30.0–36.0)
MCV: 99.7 fL (ref 80.0–100.0)
Platelets: 274 10*3/uL (ref 150–400)
RBC: 3.71 MIL/uL — ABNORMAL LOW (ref 3.87–5.11)
RDW: 13.1 % (ref 11.5–15.5)
WBC: 6.6 10*3/uL (ref 4.0–10.5)
nRBC: 0 % (ref 0.0–0.2)

## 2021-04-12 LAB — LIPASE, BLOOD: Lipase: 25 U/L (ref 11–51)

## 2021-04-12 LAB — I-STAT BETA HCG BLOOD, ED (MC, WL, AP ONLY): I-stat hCG, quantitative: <5 m[IU]/mL (ref <5)

## 2021-04-12 MED ORDER — ONDANSETRON 4 MG PO TBDP
4.0000 mg | ORAL_TABLET | Freq: Once | ORAL | Status: AC | PRN
  Administered 2021-04-12: 4 mg via ORAL
  Filled 2021-04-12: qty 1

## 2021-04-12 NOTE — ED Triage Notes (Signed)
Patient arrives stating that when she woke up at 1800 last night, she was having stomach pain. Patient attempted to use gas x, and states shortly after she began to throw up. No diarrhea.

## 2021-04-12 NOTE — ED Notes (Signed)
Labeled specimen cup provided to pt for urine collection per MD order. ENMiles 

## 2021-04-12 NOTE — ED Provider Notes (Signed)
Emergency Medicine Provider Triage Evaluation Note  Jean Reynolds , a 35 y.o. female  was evaluated in triage.  Pt complains of generalized abdominal pain, multiple episodes of nonbloody nonbilious vomiting.  Patient noted some abdominal pain which started earlier this evening, thought it was gas, took some Gas-X and has had multiple episodes of vomiting since.  Denies any fevers or chills.  Denies any pelvic pain, vaginal bleeding or discharge.  She endorses smoking marijuana yesterday.  Review of Systems  Positive: As above Negative: As above  Physical Exam  BP (!) 146/93 (BP Location: Right Arm)   Pulse 81   Temp 98.7 F (37.1 C) (Oral)   Resp 18   Ht 5\' 2"  (1.575 m)   Wt 63.5 kg   SpO2 100%   BMI 25.61 kg/m  Gen:   Awake, no distress   Resp:  Normal effort  MSK:   Moves extremities without difficulty  Other:  Minimal generalized abdominal tenderness, nonfocal, no peritoneal signs, no guarding  Medical Decision Making  Medically screening exam initiated at 2:06 AM.  Appropriate orders placed.  Jean Reynolds was informed that the remainder of the evaluation will be completed by another provider, this initial triage assessment does not replace that evaluation, and the importance of remaining in the ED until their evaluation is complete.     Jean Reynolds 04/12/21 Jean Reynolds, April, MD 04/12/21 [phone]

## 2021-04-13 IMAGING — CR DG ABDOMEN 2V
2 series · 2 of 2 positions shown · non-contrast
Comparison: CT scan 02/16/2019

CLINICAL DATA: Constipation.

EXAM:
ABDOMEN - 2 VIEW

[w abdomen upright]
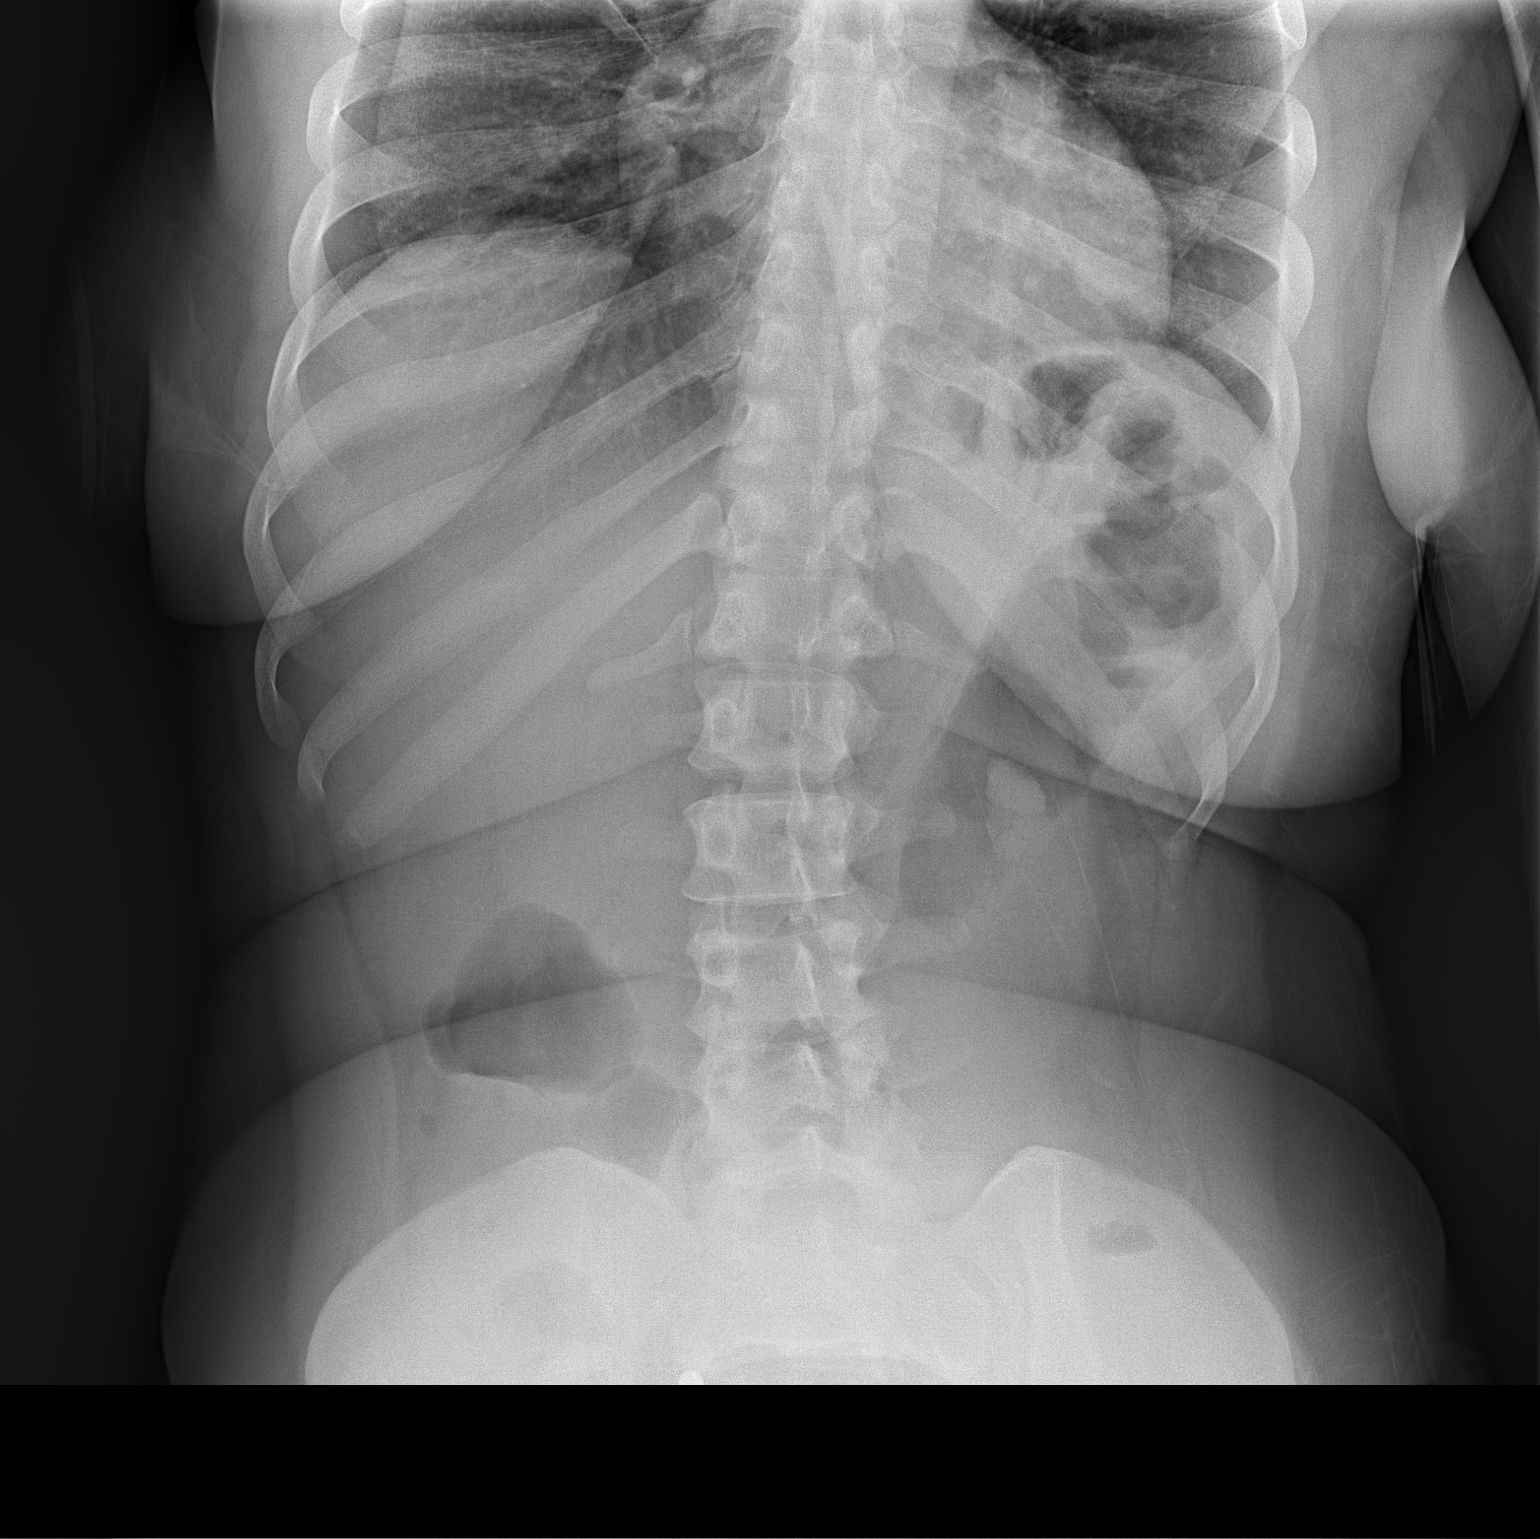

[t abdomen supine]
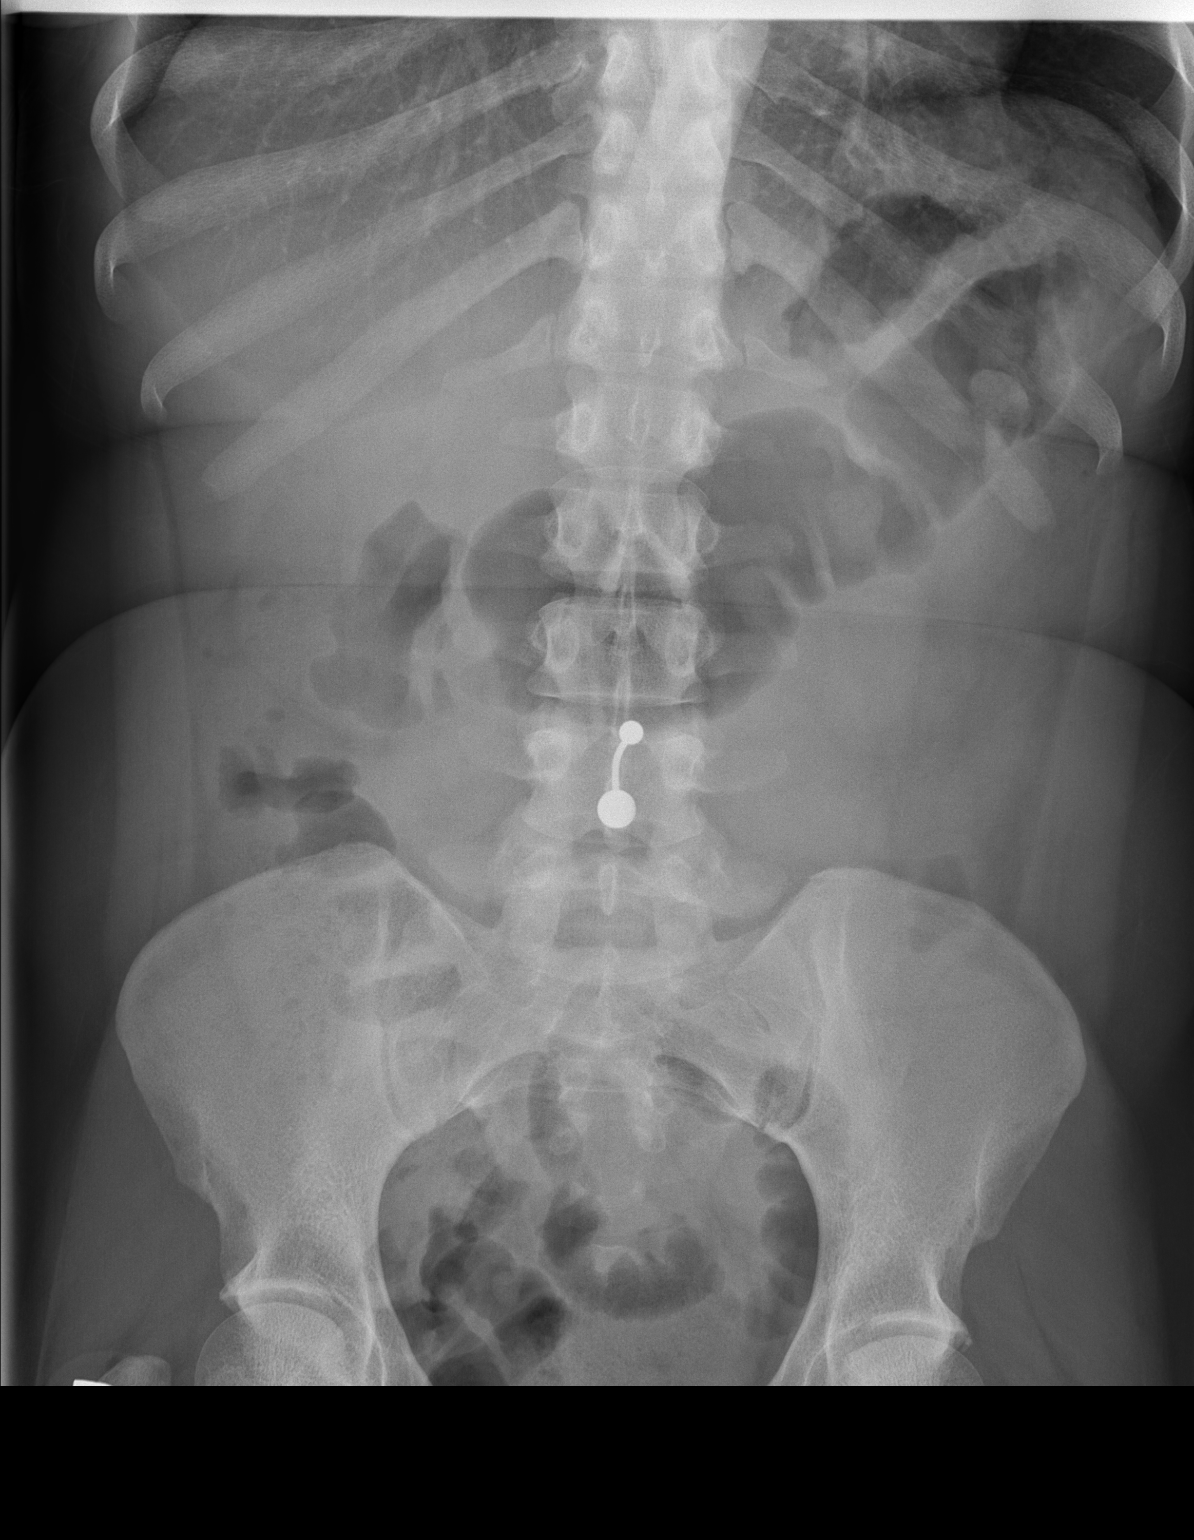

[2 of 2 positions shown; findings below may reference images not displayed]

FINDINGS: Upright film shows no evidence for intraperitoneal free air. There
is no evidence for gaseous bowel dilation to suggest obstruction. No
unexpected abdominopelvic calcification. Visualized bony anatomy
unremarkable.
IMPRESSION: Negative.
